# Patient Record
Sex: Female | Born: 1980 | Race: Black or African American | Hispanic: No | Smoking: Current every day smoker
Health system: Southern US, Community
[De-identification: ages and names within clinical notes are randomized; demographics above are authoritative.]

## PROBLEM LIST (undated history)

## (undated) ENCOUNTER — Inpatient Hospital Stay (HOSPITAL_COMMUNITY): Payer: Self-pay

## (undated) DIAGNOSIS — IMO0002 Reserved for concepts with insufficient information to code with codable children: Secondary | ICD-10-CM

## (undated) DIAGNOSIS — J45909 Unspecified asthma, uncomplicated: Secondary | ICD-10-CM

## (undated) DIAGNOSIS — M419 Scoliosis, unspecified: Secondary | ICD-10-CM

## (undated) HISTORY — PX: HAND SURGERY: SHX662

## (undated) HISTORY — PX: DILATION AND CURETTAGE OF UTERUS: SHX78

---

## 2004-05-11 ENCOUNTER — Emergency Department (HOSPITAL_COMMUNITY): Admission: EM | Admit: 2004-05-11 | Discharge: 2004-05-11 | Payer: Self-pay | Admitting: Emergency Medicine

## 2005-01-09 ENCOUNTER — Emergency Department (HOSPITAL_COMMUNITY): Admission: EM | Admit: 2005-01-09 | Discharge: 2005-01-09 | Payer: Self-pay | Admitting: Emergency Medicine

## 2005-04-20 ENCOUNTER — Inpatient Hospital Stay (HOSPITAL_COMMUNITY): Admission: AD | Admit: 2005-04-20 | Discharge: 2005-04-21 | Payer: Self-pay | Admitting: Obstetrics & Gynecology

## 2005-04-21 ENCOUNTER — Inpatient Hospital Stay (HOSPITAL_COMMUNITY): Admission: AD | Admit: 2005-04-21 | Discharge: 2005-04-21 | Payer: Self-pay | Admitting: Family Medicine

## 2005-04-25 ENCOUNTER — Inpatient Hospital Stay (HOSPITAL_COMMUNITY): Admission: AD | Admit: 2005-04-25 | Discharge: 2005-04-25 | Payer: Self-pay | Admitting: Obstetrics & Gynecology

## 2005-04-29 ENCOUNTER — Inpatient Hospital Stay (HOSPITAL_COMMUNITY): Admission: AD | Admit: 2005-04-29 | Discharge: 2005-04-29 | Payer: Self-pay | Admitting: Family Medicine

## 2005-05-06 ENCOUNTER — Observation Stay (HOSPITAL_COMMUNITY): Admission: AD | Admit: 2005-05-06 | Discharge: 2005-05-07 | Payer: Self-pay | Admitting: Obstetrics & Gynecology

## 2005-05-13 ENCOUNTER — Inpatient Hospital Stay (HOSPITAL_COMMUNITY): Admission: AD | Admit: 2005-05-13 | Discharge: 2005-05-13 | Payer: Self-pay | Admitting: Obstetrics & Gynecology

## 2005-05-13 ENCOUNTER — Ambulatory Visit: Payer: Self-pay | Admitting: Obstetrics & Gynecology

## 2005-05-14 ENCOUNTER — Inpatient Hospital Stay (HOSPITAL_COMMUNITY): Admission: AD | Admit: 2005-05-14 | Discharge: 2005-05-14 | Payer: Self-pay | Admitting: Obstetrics & Gynecology

## 2005-06-04 ENCOUNTER — Inpatient Hospital Stay (HOSPITAL_COMMUNITY): Admission: AD | Admit: 2005-06-04 | Discharge: 2005-06-04 | Payer: Self-pay | Admitting: *Deleted

## 2005-06-11 ENCOUNTER — Inpatient Hospital Stay (HOSPITAL_COMMUNITY): Admission: AD | Admit: 2005-06-11 | Discharge: 2005-06-12 | Payer: Self-pay | Admitting: Family Medicine

## 2005-06-14 ENCOUNTER — Inpatient Hospital Stay (HOSPITAL_COMMUNITY): Admission: AD | Admit: 2005-06-14 | Discharge: 2005-06-14 | Payer: Self-pay | Admitting: Obstetrics and Gynecology

## 2005-06-18 ENCOUNTER — Inpatient Hospital Stay (HOSPITAL_COMMUNITY): Admission: AD | Admit: 2005-06-18 | Discharge: 2005-06-19 | Payer: Self-pay | Admitting: Obstetrics and Gynecology

## 2005-06-24 ENCOUNTER — Inpatient Hospital Stay (HOSPITAL_COMMUNITY): Admission: AD | Admit: 2005-06-24 | Discharge: 2005-06-25 | Payer: Self-pay | Admitting: Obstetrics and Gynecology

## 2005-07-06 ENCOUNTER — Inpatient Hospital Stay (HOSPITAL_COMMUNITY): Admission: AD | Admit: 2005-07-06 | Discharge: 2005-07-08 | Payer: Self-pay | Admitting: Obstetrics and Gynecology

## 2005-07-23 ENCOUNTER — Inpatient Hospital Stay (HOSPITAL_COMMUNITY): Admission: AD | Admit: 2005-07-23 | Discharge: 2005-07-23 | Payer: Self-pay | Admitting: Obstetrics and Gynecology

## 2005-08-04 ENCOUNTER — Inpatient Hospital Stay (HOSPITAL_COMMUNITY): Admission: AD | Admit: 2005-08-04 | Discharge: 2005-08-05 | Payer: Self-pay | Admitting: Obstetrics and Gynecology

## 2005-08-11 ENCOUNTER — Inpatient Hospital Stay (HOSPITAL_COMMUNITY): Admission: AD | Admit: 2005-08-11 | Discharge: 2005-08-12 | Payer: Self-pay | Admitting: Obstetrics and Gynecology

## 2005-08-29 ENCOUNTER — Inpatient Hospital Stay (HOSPITAL_COMMUNITY): Admission: AD | Admit: 2005-08-29 | Discharge: 2005-08-29 | Payer: Self-pay | Admitting: Obstetrics and Gynecology

## 2005-10-02 ENCOUNTER — Inpatient Hospital Stay (HOSPITAL_COMMUNITY): Admission: AD | Admit: 2005-10-02 | Discharge: 2005-10-02 | Payer: Self-pay | Admitting: Obstetrics and Gynecology

## 2005-11-25 ENCOUNTER — Inpatient Hospital Stay (HOSPITAL_COMMUNITY): Admission: AD | Admit: 2005-11-25 | Discharge: 2005-11-27 | Payer: Self-pay | Admitting: Obstetrics and Gynecology

## 2008-01-19 ENCOUNTER — Inpatient Hospital Stay (HOSPITAL_COMMUNITY): Admission: AD | Admit: 2008-01-19 | Discharge: 2008-01-19 | Payer: Self-pay | Admitting: Gynecology

## 2008-01-30 ENCOUNTER — Inpatient Hospital Stay (HOSPITAL_COMMUNITY): Admission: AD | Admit: 2008-01-30 | Discharge: 2008-01-30 | Payer: Self-pay | Admitting: Obstetrics

## 2008-02-05 ENCOUNTER — Inpatient Hospital Stay (HOSPITAL_COMMUNITY): Admission: AD | Admit: 2008-02-05 | Discharge: 2008-02-07 | Payer: Self-pay | Admitting: Obstetrics

## 2008-02-23 ENCOUNTER — Inpatient Hospital Stay (HOSPITAL_COMMUNITY): Admission: AD | Admit: 2008-02-23 | Discharge: 2008-02-24 | Payer: Self-pay | Admitting: Obstetrics

## 2008-02-28 ENCOUNTER — Inpatient Hospital Stay (HOSPITAL_COMMUNITY): Admission: AD | Admit: 2008-02-28 | Discharge: 2008-02-29 | Payer: Self-pay | Admitting: Obstetrics

## 2008-03-02 ENCOUNTER — Inpatient Hospital Stay (HOSPITAL_COMMUNITY): Admission: AD | Admit: 2008-03-02 | Discharge: 2008-03-05 | Payer: Self-pay | Admitting: Obstetrics

## 2008-04-12 ENCOUNTER — Emergency Department (HOSPITAL_COMMUNITY): Admission: EM | Admit: 2008-04-12 | Discharge: 2008-04-12 | Payer: Self-pay | Admitting: Emergency Medicine

## 2009-08-02 DIAGNOSIS — R87619 Unspecified abnormal cytological findings in specimens from cervix uteri: Secondary | ICD-10-CM

## 2009-08-02 DIAGNOSIS — IMO0002 Reserved for concepts with insufficient information to code with codable children: Secondary | ICD-10-CM

## 2009-08-02 HISTORY — DX: Reserved for concepts with insufficient information to code with codable children: IMO0002

## 2009-08-02 HISTORY — DX: Unspecified abnormal cytological findings in specimens from cervix uteri: R87.619

## 2010-03-08 ENCOUNTER — Emergency Department (HOSPITAL_BASED_OUTPATIENT_CLINIC_OR_DEPARTMENT_OTHER): Admission: EM | Admit: 2010-03-08 | Discharge: 2010-03-08 | Payer: Self-pay | Admitting: Emergency Medicine

## 2010-08-23 ENCOUNTER — Encounter: Payer: Self-pay | Admitting: Family Medicine

## 2010-12-15 NOTE — Discharge Summary (Signed)
NAMETALAYLA, DOYEL               ACCOUNT NO.:  [id number]   MEDICAL RECORD NO.:  [id number]          PATIENT TYPE:  INP   LOCATION:  9309                          [facility]:  [facility]   PHYSICIAN:  Kathreen Cosier, M.D.DATE OF BIRTH:  08-20-1980   DATE OF ADMISSION:  02/05/2008  DATE OF DISCHARGE:  02/07/2008                               DISCHARGE SUMMARY   The patient is a 30 year old para 2, 7.[redacted] weeks pregnant.  The patient  was admitted because of hyperemesis and she was taking Zofran at home  and Benadryl, and there was no improvement in her symptoms.  She was  admitted and started on the steroid taper and by February 07, 2008, she had  no more vomiting.  The patient also had a very uncooperative attitude  and wanted to be discharged, this was done.  She was discharged on the  Medrol taper to see me in 2 weeks.   DISCHARGE DIAGNOSIS:  Status post hyperemesis.           ______________________________  Kathreen Cosier, M.D.     BAM/MEDQ  D:  04/03/2008  T:  04/03/2008  Job:  [id number]

## 2010-12-15 NOTE — Discharge Summary (Signed)
NAMERANYIA, WITTING               ACCOUNT NO.:  [id number]   MEDICAL RECORD NO.:  [id number]          PATIENT TYPE:  INP   LOCATION:                                [facility]   PHYSICIAN:  Kathreen Cosier, M.D.DATE OF BIRTH:  September 18, 1980   DATE OF ADMISSION:  02/28/2008  DATE OF DISCHARGE:  02/29/2008                               DISCHARGE SUMMARY   The patient is a 30 year old gravida 3, para 2-0-1-2, [facility] September 11, 2008.  She was hospitalized on February 05, 2008, with severe hyperemesis,  which occurs in all of her pregnancies.  She was discharged at that time  on a steroid taper.  She states that with all of her pregnancies she has  this problem into her third trimester.  She was seen by Maternal Fetal  Medicine on February 28, 2008, and she was on the steroid taper and on February 29, 2008, she was trying to tolerate clear liquids and she wanted some  Ensure and she then decided that she wanted to go home and signed  herself out AMA after cursing with staff and everyone else in the  hospital.           ______________________________  Kathreen Cosier, M.D.     BAM/MEDQ  D:  04/03/2008  T:  04/03/2008  Job:  [id number]

## 2010-12-15 NOTE — H&P (Signed)
[name], [name]               ACCOUNT NO.:  [id number]   MEDICAL RECORD NO.:  [id number]          PATIENT TYPE:  INP   LOCATION:  9309                          [facility]   PHYSICIAN:  Roseanna Rainbow, M.D.DATE OF BIRTH:  Sep 28, 1980   DATE OF ADMISSION:  02/05/2008  DATE OF DISCHARGE:                              HISTORY & PHYSICAL   CHIEF COMPLAINT:  The patient is a 30 year old para 2, early pregnant by  dates 7.5 weeks complaining of nausea and vomiting.   HISTORY OF PRESENT ILLNESS:  Please see the above.  The patient has a  history of pregnancy-related nausea and vomiting with her 2 prior  pregnancies.  With the last pregnancy, the vomiting persisted into the  third trimester.  She also has constant drooling.  She is currently  taking Zofran for the nausea.   PAST MEDICAL HISTORY:  She denies.   ALLERGIES:  PHENERGAN.   SOCIAL HISTORY:  Current smoker.  She denies any drug abuse.  No alcohol  use.   FAMILY HISTORY:  Noncontributory.   PAST OB/GYN HISTORY:  There is a history of chlamydia.  Please see the  above.   MEDICATIONS:  Zofran.   REVIEW OF SYSTEMS:  GI:  Please see the above.   PHYSICAL EXAMINATION:  VITAL SIGNS:  Blood pressures 90s to 150s over  50s to 90s, temperature 97.3, pulse 60, and respirations 20.  GENERAL:  No apparent distress.  ABDOMEN:  Nontender.  PELVIS:  Deferred.   LABORATORY DATA:  White blood cell count 11.6, hemoglobin 12.8, and  platelets 314,000.  CMET normal.  Urinalysis remarkable for greater than  80 ketones.   ASSESSMENT:  Multipara at 7 plus weeks with hyperemesis gravidarum.   PLAN:  Admission.  We will add Reglan and Benadryl to the Zofran, the  patient is currently taking.  We will keep the patient n.p.o. until the  nausea is better controlled.  We will obtain an ultrasound for dating.  We will review the previous chart.  Also, we will obtain a nutrition  consult.      Roseanna Rainbow, M.D.  Electronically Signed     LAJ/MEDQ  D:  02/06/2008  T:  02/06/2008  Job:  [id number]

## 2010-12-18 NOTE — H&P (Signed)
[name], [name]               ACCOUNT NO.:  [id number]   MEDICAL RECORD NO.:  [id number]          PATIENT TYPE:  MAT   LOCATION:  MATC                          [facility]  [facility]   PHYSICIAN:  Malachi Pro. Ambrose Mantle, M.D. DATE OF BIRTH:  [birth year]   DATE OF ADMISSION:  06/18/2005  DATE OF DISCHARGE:                                HISTORY & PHYSICAL   PRESENT ILLNESS:  This is a 30 year old black single female para 1-0-0-1  gravida 2 with EDC Dec 04, 2005, by ultrasound done on June 11, 2005,  admitted with persistent hyperemesis gravidarum. This patient apparently has  been to the hospital eight times previously for nausea and vomiting of  pregnancy, and now comes to our office for her ninth evaluation for  hyperemesis. She is admitted to the hospital because of persistent nausea  and vomiting after having been treated previously with Zofran, Reglan, and  Phenergan. She claims that she is unable to keep anything down and is  admitted for that reason.   PAST MEDICAL HISTORY:  Reveals no known allergies, no operations, no  significant illnesses.   OBSTETRICAL HISTORY:  In September 2000 she had a 6-pound 8-ounce female  infant delivered vaginally after a 12-hour labor at [redacted] weeks gestation  without complications. The patient did have chlamydia during this pregnancy  2 months ago and was treated with azithromycin.   FAMILY HISTORY:  Paternal grandmother has high blood pressure and diabetes.  Her mother has sickle cell disease. The patient herself has sickle cell  trait.   REVIEW OF SYSTEMS:  Noncontributory except for persistent nausea and  vomiting and for dizziness that she attributes to Zofran.   PHYSICAL EXAMINATION:  GENERAL:  Reveals a well-developed, slender black  female/  VITAL SIGNS:  Blood pressure 90/60, pulse of 90, weight 98.5 pounds.  HEENT:  Normal.  LUNGS:  Clear to auscultation.  HEART:  Normal size and sounds, no murmurs.  BREASTS:  Not examined.  ABDOMEN:   Soft. Fundal height is about 8-9 cm. Fetal heart tones are normal.  PELVIC:  Cervix is closed. Uterus about 15- to 16 week size. The adnexa are  clear.   ADMITTING IMPRESSION:  Intrauterine pregnancy at 15 weeks 6 days, persistent  hyperemesis gravidarum.   The patient is admitted for hepatitis B surface antigen, RPR, rubella titer,  blood group and type. She has already undergone an STD panel here, and has  had a Pap smear. The patient is admitted for IV fluids.     Malachi Pro. Ambrose Mantle, M.D.  Electronically Signed    TFH/MEDQ  D:  06/18/2005  T:  06/18/2005  Job:  [id number]

## 2010-12-18 NOTE — Discharge Summary (Signed)
[name], MAIDEN               ACCOUNT NO.:  [id number]   MEDICAL RECORD NO.:  [id number]          PATIENT TYPE:  INP   LOCATION:  9303                          [facility]   PHYSICIAN:  Kathreen Cosier, M.D.DATE OF BIRTH:  Dec 11, 1980   DATE OF ADMISSION:  03/02/2008  DATE OF DISCHARGE:  03/05/2008                               DISCHARGE SUMMARY   The patient is a 29 year old gravida 2, para 1, and 11 weeks' pregnant  was admitted with persistent nausea and vomiting.  She was treated at  home with Zofran and Reglan with no improvement, and she was brought in  and started on the IV steroid taper, and by March 04, 2008, felt much  better and was tolerating her regular diet.  She was discharged on  March 05, 2008, on Zofran 4 mg p.o. every 4-6 hours, Pepcid 20 mg  b.i.d., and a p.o. Medrol taper and also Ensure 3 times a day.   DISCHARGE DIAGNOSIS:  Status post hospitalization for hyperemesis.           ______________________________  Kathreen Cosier, M.D.     BAM/MEDQ  D:  03/20/2008  T:  03/20/2008  Job:  [id number]

## 2010-12-18 NOTE — Discharge Summary (Signed)
NAMEBASIL, [name]               ACCOUNT NO.:  [id number]   MEDICAL RECORD NO.:  [id number]          PATIENT TYPE:  INP   LOCATION:  9108                          [facility]   PHYSICIAN:  Huel Cote, M.D. DATE OF BIRTH:  July 12, 1981   DATE OF ADMISSION:  11/25/2005  DATE OF DISCHARGE:  11/27/2005                                 DISCHARGE SUMMARY   DISCHARGE DIAGNOSES:  1.  Term pregnancy at 38 plus weeks, delivered.  2.  Status post normal spontaneous vaginal delivery.  3.  Prenatal care complicated by severe nausea, vomiting during pregnancy.   DISCHARGE MEDICATIONS:  1.  Motrin 600 mg p.o. every 6 hours.  2.  Percocet 1-2 tablets every 4 hours p.r.n.  3.  Depo-Provera 150 mg IM x1 per patient request.   HOSPITAL COURSE:  The patient's 30 year old, G2, P1-0-0-1 who was admitted  for induction of labor and 38-5/7 weeks. As stated, her prenatal care been  complicated by excessive nausea and vomiting which required several  admissions and had been on p.o. Zofran her entire pregnancy. Prenatal labs  are as follows. O+, antibody negative, hepatitis C negative, RPR  nonreactive, rubella immune, hepatitis B surface antigen negative, HIV  negative, GC negative, chlamydia negative, group B strep negative, triple  screen normal, 1-hour Glucola 95.   PAST MEDICAL HISTORY:  Sickle cell trait hemoglobin C present with some  anemia.   SIGNIFICANT PAST SURGICAL HISTORY:  She had a ganglion cyst removed from her  wrist in 1999.   PAST GYN HISTORY:  Remote history of chlamydia.  She also had a normal Pap  smear with a colposcopy performed this pregnancy and planned followup  postpartum.   PAST OBSTETRICAL HISTORY:  In 2000, she had a 6 pound 8 ounce female infant  vaginally delivered.   HOSPITAL COURSE:  The patient was admitted and on admission had a cervix  that was 1, 50% , -2 station.  She was placed on Pitocin and progressed and  had rupture of membranes performed at 3-4 cm.   She then rapidly progressed  to complete dilation and pushed well with a vigorous female infant delivered,  5 pounds 12 ounces, Apgars were 8 and 9.  She was admitted for routine  postpartum care. By postpartum day #2, she was feeling quite well.  Her  nausea had completely resolved, and she requested discharge home.  She also  requested  Depo-Provera 150 mg IM prior to discharge for birth control, and  this was given as per her request.      Huel Cote, M.D.  Electronically Signed    KR/MEDQ  D:  11/27/2005  T:  11/28/2005  Job:  [id number]

## 2010-12-18 NOTE — Discharge Summary (Signed)
[name], [name]               ACCOUNT NO.:  [id number]   MEDICAL RECORD NO.:  [id number]          PATIENT TYPE:  INP   LOCATION:  9318                          [facility]   PHYSICIAN:  Huel Cote, M.D. DATE OF BIRTH:  Dec 19, 1980   DATE OF ADMISSION:  07/06/2005  DATE OF DISCHARGE:  07/08/2005                                 DISCHARGE SUMMARY   DISCHARGE DIAGNOSES:  Preterm pregnancy at 18 weeks with recurrent nausea  and vomiting.   DISCHARGE MEDICATIONS:  1.  Reglan 10 mg p.o. every eight hours.  2.  Zofran 8 mg p.o. t.i.d. p.r.n.   The patient was instructed to follow up in the office within one week of  discharge.   HOSPITAL COURSE:  The patient is a 30 year old G2, P1-0-0-1, who was  admitted in 82 weeks' gestation with complaint of recurrent nausea and  vomiting.  She had had numerous visits to maternity admission and previous  admission for hydration and antiemetics, which had resolved intermittently,  however had a new episode of nausea and vomiting which brought her in this  admission.  She was then admitted and placed on IV Reglan.  When she did not  improve after hydration and Zofran in maternity admissions, the patient was  admitted to the floor for overnight hydration and placed on IV Reglan.  By  the next morning the patient was feeling better, however still had some  nausea and vomiting.  She was given the option of continuing hydration and  changing to p.o. Zofran and Reglan and to see if she could tolerate a solid  diet; however, later in the morning the patient insisted on discharge,  stating that she would not eat hospital food as it was not anything she  could tolerate due to her dietary preferences.  She stated that she would  leave AMA if not discharged and insisted upon being discharged home.  Therefore, the patient was allowed discharge home, to follow up in the  office as previously stated.      Huel Cote, M.D.  Electronically  Signed     KR/MEDQ  D:  08/18/2005  T:  08/18/2005  Job:  [id number]

## 2010-12-18 NOTE — Discharge Summary (Signed)
[name], [name]               ACCOUNT NO.:  [id number]   MEDICAL RECORD NO.:  [id number]          PATIENT TYPE:  INP   LOCATION:  9308                          [facility]   PHYSICIAN:  Lesly Dukes, M.D. DATE OF BIRTH:  12-08-1980   DATE OF ADMISSION:  05/06/2005  DATE OF DISCHARGE:  05/07/2005                                 DISCHARGE SUMMARY   ADMISSION DIAGNOSIS:  Hyperemesis gravidarum.   DISCHARGE DIAGNOSES:  1.  Hyperemesis gravidarum.  2.  Nine-week intrauterine pregnancy.   HISTORY OF PRESENT ILLNESS:  The patient is a 30 year old G2, P1, 0, 0, 1 at  9-5/7 weeks by ultrasound, who was admitted with hyperemesis gravidarum.  This has been her fifth visit to the [facility]U. She has lost 11 pounds. She states  that she had the same problem with her last pregnancy until 3 months  gestation. She has been on Phenergan per rectum and by mouth and stated that  has not relieved her nausea and vomiting. She was hospitalized one time in  the previous pregnancy for this. This was an unplanned pregnancy which she  stated she would have terminated if she had the funds, but plans to keep the  baby. On exam she was afebrile, vital signs were stable. She was in no acute  distress. Respiratory was clear to auscultation bilaterally. Normal S1, S2,  no murmurs, gallops or rubs. Her abdomen was soft, nontender and  nondistended. Extremities had no clubbing, cyanosis or edema.   HOSPITAL COURSE:  The patient was admitted and fluid resuscitated. She  received Zofran in her IV fluids. She was started on p.o. Reglan and Zofran.  She said these medications worked better for her than the Phenergan. She was  able to tolerate some liquid as well as food before the time of discharge.  Home Health was arranged to give her a Reglan pump which was started before  she went home.   SIGNIFICANT LABORATORY:  H. pylori was negative. TSH was 0.158 which is low,  her free T3 was 3.3 which is normal, and  her free T4 was 1.23. On the day of  discharge her sodium was 136, potassium 3.6, chloride 102, CO2 24, glucose  113, BUN less than 1, creatinine 0.5. Total bilirubin 0.4, alkaline  phosphatase 39, AST and ALT were 21, total protein 5.7, albumin 3.0, calcium  8.9.   DISCHARGE INSTRUCTIONS:  The patient was discharged to home on a bland diet.  She was to call Monday and make an appointment with [facility]. A Reglan  pump was started with advanced home care as well as p.r.n. IV fluid boluses  if she has ketones in urine, increased specific gravity, weight loss or  vomiting x5 or p.r.n. The Reglan pump can be titrated up. They are to call  the resident on call to adjust the Reglan if needed.     ______________________________  August Saucer Merlene Morse, MD    ______________________________  Lesly Dukes, M.D.   ABC/MEDQ  D:  05/07/2005  T:  05/07/2005  Job:  [id number]

## 2010-12-22 ENCOUNTER — Inpatient Hospital Stay (HOSPITAL_COMMUNITY)
Admission: AD | Admit: 2010-12-22 | Discharge: 2010-12-22 | Disposition: A | Discharge status: Home / Self Care | Payer: Medicaid Other | Source: Ambulatory Visit | Attending: Obstetrics & Gynecology | Admitting: Obstetrics & Gynecology

## 2010-12-22 DIAGNOSIS — A5901 Trichomonal vulvovaginitis: Secondary | ICD-10-CM

## 2010-12-22 DIAGNOSIS — N72 Inflammatory disease of cervix uteri: Secondary | ICD-10-CM | POA: Insufficient documentation

## 2010-12-22 DIAGNOSIS — R109 Unspecified abdominal pain: Secondary | ICD-10-CM | POA: Insufficient documentation

## 2010-12-22 DIAGNOSIS — N390 Urinary tract infection, site not specified: Secondary | ICD-10-CM | POA: Insufficient documentation

## 2010-12-22 LAB — POCT PREGNANCY, URINE: Preg Test, Ur: NEGATIVE

## 2010-12-22 LAB — URINALYSIS, ROUTINE W REFLEX MICROSCOPIC

## 2010-12-22 LAB — URINE MICROSCOPIC-ADD ON

## 2010-12-22 LAB — WET PREP, GENITAL: Clue Cells Wet Prep HPF POC: NONE SEEN

## 2010-12-23 LAB — GC/CHLAMYDIA PROBE AMP, GENITAL
Chlamydia, DNA Probe: NEGATIVE
GC Probe Amp, Genital: NEGATIVE

## 2010-12-27 ENCOUNTER — Inpatient Hospital Stay (INDEPENDENT_AMBULATORY_CARE_PROVIDER_SITE_OTHER)
Admission: RE | Admit: 2010-12-27 | Discharge: 2010-12-27 | Disposition: A | Discharge status: Home / Self Care | Payer: Medicaid Other | Source: Ambulatory Visit | Attending: Emergency Medicine | Admitting: Emergency Medicine

## 2010-12-27 DIAGNOSIS — M549 Dorsalgia, unspecified: Secondary | ICD-10-CM

## 2010-12-27 DIAGNOSIS — S139XXA Sprain of joints and ligaments of unspecified parts of neck, initial encounter: Secondary | ICD-10-CM

## 2011-02-20 ENCOUNTER — Inpatient Hospital Stay (INDEPENDENT_AMBULATORY_CARE_PROVIDER_SITE_OTHER)
Admission: RE | Admit: 2011-02-20 | Discharge: 2011-02-20 | Disposition: A | Discharge status: Home / Self Care | Payer: Medicaid Other | Source: Ambulatory Visit | Attending: Emergency Medicine | Admitting: Emergency Medicine

## 2011-02-20 DIAGNOSIS — S239XXA Sprain of unspecified parts of thorax, initial encounter: Secondary | ICD-10-CM

## 2011-04-29 LAB — URINALYSIS, ROUTINE W REFLEX MICROSCOPIC
Bilirubin Urine: NEGATIVE
Glucose, UA: NEGATIVE
Ketones, ur: 15 — AB
Ketones, ur: 40 — AB
Leukocytes, UA: NEGATIVE
Nitrite: NEGATIVE
Nitrite: NEGATIVE
Protein, ur: NEGATIVE
Protein, ur: NEGATIVE
Specific Gravity, Urine: 1.02
Specific Gravity, Urine: >1.03 — ABNORMAL HIGH
Urobilinogen, UA: 0.2
Urobilinogen, UA: 0.2
pH: 7

## 2011-04-29 LAB — COMPREHENSIVE METABOLIC PANEL
BUN: 6
Glucose, Bld: 120 — ABNORMAL HIGH
Total Bilirubin: 1.2

## 2011-04-29 LAB — URINALYSIS, DIPSTICK ONLY
Bilirubin Urine: NEGATIVE
Ketones, ur: >80 — AB
Protein, ur: NEGATIVE
Specific Gravity, Urine: 1.02
Urobilinogen, UA: 0.2
pH: 8

## 2011-04-29 LAB — CBC
HCT: 38.5
MCV: 83
Platelets: 314
WBC: 11.6 — ABNORMAL HIGH

## 2011-04-29 LAB — URINE MICROSCOPIC-ADD ON

## 2011-04-29 LAB — POCT PREGNANCY, URINE: Operator id: 11741

## 2011-04-30 LAB — BASIC METABOLIC PANEL
CO2: 25
Calcium: 8.7
Creatinine, Ser: <0.3 — ABNORMAL LOW
Sodium: 133 — ABNORMAL LOW

## 2011-04-30 LAB — CBC
HCT: 38.6
Hemoglobin: 12.8
MCHC: 33.2
Platelets: 256
RBC: 4.61

## 2011-04-30 LAB — URINALYSIS, ROUTINE W REFLEX MICROSCOPIC
Bilirubin Urine: NEGATIVE
Hgb urine dipstick: NEGATIVE
Ketones, ur: NEGATIVE
Leukocytes, UA: NEGATIVE
Protein, ur: NEGATIVE
Specific Gravity, Urine: 1.025
Specific Gravity, Urine: 1.02
Urobilinogen, UA: 0.2
pH: 6.5

## 2011-04-30 LAB — COMPREHENSIVE METABOLIC PANEL
CO2: 24
Chloride: 102
Creatinine, Ser: 0.43
GFR calc Af Amer: >60
Total Bilirubin: 0.6

## 2011-04-30 LAB — URINE MICROSCOPIC-ADD ON

## 2011-05-05 LAB — URINALYSIS, ROUTINE W REFLEX MICROSCOPIC
Glucose, UA: NEGATIVE
Ketones, ur: >80 — AB
Protein, ur: 30 — AB
Urobilinogen, UA: 0.2

## 2011-05-05 LAB — DIFFERENTIAL
Basophils Absolute: 0
Basophils Relative: 0
Eosinophils Absolute: 0
Eosinophils Relative: 0
Lymphs Abs: 2.4
Neutrophils Relative %: 75

## 2011-05-05 LAB — CBC
Hemoglobin: 11.6 — ABNORMAL LOW
MCHC: 32.9
RBC: 4.16
WBC: 11.5 — ABNORMAL HIGH

## 2011-05-05 LAB — COMPREHENSIVE METABOLIC PANEL
ALT: 12
AST: 19
Alkaline Phosphatase: 46
CO2: 25
Chloride: 104
GFR calc Af Amer: >60
GFR calc non Af Amer: >60
Glucose, Bld: 114 — ABNORMAL HIGH
Potassium: 3.2 — ABNORMAL LOW
Sodium: 139
Total Bilirubin: 0.7

## 2011-05-05 LAB — URINE MICROSCOPIC-ADD ON

## 2011-10-11 ENCOUNTER — Encounter (HOSPITAL_COMMUNITY): Payer: Self-pay

## 2011-10-11 ENCOUNTER — Inpatient Hospital Stay (HOSPITAL_COMMUNITY)
Admission: AD | Admit: 2011-10-11 | Discharge: 2011-10-11 | Disposition: A | Discharge status: Home / Self Care | Payer: Medicaid Other | Source: Ambulatory Visit | Attending: Obstetrics & Gynecology | Admitting: Obstetrics & Gynecology

## 2011-10-11 ENCOUNTER — Inpatient Hospital Stay (HOSPITAL_COMMUNITY): Payer: Medicaid Other

## 2011-10-11 DIAGNOSIS — R112 Nausea with vomiting, unspecified: Secondary | ICD-10-CM | POA: Insufficient documentation

## 2011-10-11 LAB — URINALYSIS, ROUTINE W REFLEX MICROSCOPIC
Hgb urine dipstick: NEGATIVE
Nitrite: NEGATIVE
Specific Gravity, Urine: 1.015 (ref 1.005–1.030)
Urobilinogen, UA: 0.2 mg/dL (ref 0.0–1.0)
pH: 7.5 (ref 5.0–8.0)

## 2011-10-11 LAB — CBC
MCH: 26.4 pg (ref 26.0–34.0)
MCHC: 34.7 g/dL (ref 30.0–36.0)
Platelets: 320 10*3/uL (ref 150–400)

## 2011-10-11 LAB — WET PREP, GENITAL: Yeast Wet Prep HPF POC: NONE SEEN

## 2011-10-11 LAB — ABO/RH: ABO/RH(D): O POS

## 2011-10-11 LAB — HCG, QUANTITATIVE, PREGNANCY: hCG, Beta Chain, Quant, S: 33044 m[IU]/mL — ABNORMAL HIGH (ref <5)

## 2011-10-11 LAB — POCT PREGNANCY, URINE: Preg Test, Ur: POSITIVE — AB

## 2011-10-11 MED ORDER — ONDANSETRON HCL 8 MG PO TABS: 4.0000 mg | ORAL_TABLET | Freq: Three times a day (TID) | ORAL | Status: DC | PRN

## 2011-10-11 MED ORDER — METOCLOPRAMIDE HCL 5 MG PO TABS: 5.0000 mg | ORAL_TABLET | Freq: Four times a day (QID) | ORAL | Status: DC

## 2011-10-11 MED ORDER — ONDANSETRON 8 MG PO TBDP
8.0000 mg | ORAL_TABLET | Freq: Once | ORAL | Status: AC
  Administered 2011-10-11: 8 mg via ORAL
  Filled 2011-10-11: qty 1

## 2011-10-11 MED ORDER — METOCLOPRAMIDE HCL 5 MG PO TABS: 5.0000 mg | ORAL_TABLET | Freq: Four times a day (QID) | ORAL | Status: DC | PRN

## 2011-10-11 NOTE — MAU Note (Signed)
Pt states lower abdominal cramping started 2 days ago. Denies vaginal bleeding.

## 2011-10-11 NOTE — MAU Provider Note (Signed)
IUP on [place] Medical Screening exam and patient care preformed by advanced practice provider / resident.  Agree with the above management.

## 2011-10-11 NOTE — Discharge Instructions (Signed)
Morning Sickness Morning sickness is when you feel sick to your stomach (nauseous) during pregnancy. This nauseous feeling may or may not come with throwing up (vomiting). It often occurs in the morning, but can be a problem any time of day. While morning sickness is unpleasant, it is usually harmless unless you develop severe and continual vomiting (hyperemesis gravidarum). This condition requires more intense treatment. CAUSES  The cause of morning sickness is not completely known but seems to be related to a sudden increase of two hormones:   Human chorionic gonadotropin (hCG).   Estrogen hormone.  These are elevated in the first part of the pregnancy. TREATMENT  Do not use any medicines (prescription, over-the-counter, or herbal) for morning sickness without first talking to your caregiver. Some patients are helped by the following:  Vitamin B6 (25mg every 8 hours) or vitamin B6 shots.   An antihistamine called doxylamine (10mg every 8 hours).   The herbal medication ginger.  HOME CARE INSTRUCTIONS   Taking multivitamins before getting pregnant can prevent or decrease the severity of morning sickness in most women.   Eat a piece of dry toast or unsalted crackers before getting out of bed in the morning.   Eat 5 or 6 small meals a day.   Eat dry and bland foods (rice, baked potato).   Do not drink liquids with your meals. Drink liquids between meals.   Avoid greasy, fatty, and spicy foods.   Get someone to cook for you if the smell of any food causes nausea and vomiting.   Avoid vitamin pills with iron because iron can cause nausea.   Snack on protein foods between meals if you are hungry.   Eat unsweetened gelatins for deserts.   Wear an acupressure wristband (worn for sea sickness) may be helpful.   Acupuncture may be helpful.   Do not smoke.   Get a humidifier to keep the air in your house free of odors.  SEEK MEDICAL CARE IF:   Your home remedies are not working  and you need medication.   You feel dizzy or lightheaded.   You are losing weight.   You need help with your diet.  SEEK IMMEDIATE MEDICAL CARE IF:   You have persistent and uncontrolled nausea and vomiting.   You pass out (faint).   You have a fever.  MAKE SURE YOU:   Understand these instructions.   Will watch your condition.   Will get help right away if you are not doing well or get worse.  Document Released: 09/09/2006 Document Revised: 07/08/2011 Document Reviewed: 07/07/2007 [employer] Patient Information 2012 [employer].Pregnancy If you are planning on getting pregnant, it is a good idea to make a preconception appointment with your care- giver to discuss having a healthy lifestyle before getting pregnant. Such as, diet, weight, exercise, taking prenatal vitamins especially folic acid (it helps prevent brain and spinal cord defects), avoiding alcohol, smoking and illegal drugs, medical problems (diabetes, convulsions), family history of genetic problems, working conditions and immunizations. It is better to have knowledge of these things and do something about them before getting pregnant. In your pregnancy, it is important to follow certain guidelines to have a healthy baby. It is very important to get good prenatal care and follow your caregiver's instructions. Prenatal care includes all the medical care you receive before your baby's birth. This helps to prevent problems during the pregnancy and childbirth. HOME CARE INSTRUCTIONS   Start your prenatal visits by the 12th week of   pregnancy or before when possible. They are usually scheduled monthly at first. They are more often in the last 2 months before delivery. It is important that you keep your caregiver's appointments and follow your caregiver's instructions regarding medication use, exercise, and diet.   During pregnancy, you are providing food for you and your baby. Eat a regular, well-balanced diet. Choose foods such  as meat, fish, milk and other dairy products, vegetables, fruits, whole-grain breads and cereals. Your caregiver will inform you of the ideal weight gain depending on your current height and weight. Drink lots of liquids. Try to drink 8 glasses of water a day.   Alcohol is associated with a number of birth defects including fetal alcohol syndrome. It is best to avoid alcohol completely. Smoking will cause low birth rate and prematurity. Use of alcohol and nicotine during your pregnancy also increases the chances that your child will be chemically dependent later in their life and may contribute to SIDS (Sudden Infant Death Syndrome).   Do not use illegal drugs.   Only take prescription or over-the-counter medications that are recommended by your caregiver. Other medications can cause genetic and physical problems in the baby.   Morning sickness can often be helped by keeping soda crackers at the bedside. Eat a couple before arising in the morning.   A sexual relationship may be continued until near the end of pregnancy if there are no other problems such as early (premature) leaking of amniotic fluid from the membranes, vaginal bleeding, painful intercourse or belly (abdominal) pain.   Exercise regularly. Check with your caregiver if you are unsure of the safety of some of your exercises.   Do not use hot tubs, steam rooms or saunas. These increase the risk of fainting or passing out and hurting yourself and the baby. Swimming is OK for exercise. Get plenty of rest, including afternoon naps when possible especially in the third trimester.   Avoid toxic odors and chemicals.   Do not wear high heels. They may cause you to lose your balance and fall.   Do not lift over 5 pounds. If you do lift anything, lift with your legs and thighs, not your back.   Avoid long trips, especially in the third trimester.   If you have to travel out of the city or state, take a copy of your medical records with  you.  SEEK IMMEDIATE MEDICAL CARE IF:   You develop an unexplained oral temperature above 102 F (38.9 C), or as your caregiver suggests.   You have leaking of fluid from the vagina. If leaking membranes are suspected, take your temperature and inform your caregiver of this when you call.   There is vaginal spotting or bleeding. Notify your caregiver of the amount and how many pads are used.   You continue to feel sick to your stomach (nauseous) and have no relief from remedies suggested, or you throw up (vomit) blood or coffee ground like materials.   You develop upper abdominal pain.   You have round ligament discomfort in the lower abdominal area. This still must be evaluated by your caregiver.   You feel contractions of the uterus.   You do not feel the baby move, or there is less movement than before.   You have painful urination.   You have abnormal vaginal discharge.   You have persistent diarrhea.   You get a severe headache.   You have problems with your vision.   You develop muscle weakness.     You feel dizzy and faint.   You develop shortness of breath.   You develop chest pain.   You have back pain that travels down to your leg and feet.   You feel irregular or a very fast heartbeat.   You develop excessive weight gain in a short period of time (5 pounds in 3 to 5 days).   You are involved with a domestic violence situation.  Document Released: 07/19/2005 Document Revised: 07/08/2011 Document Reviewed: 01/10/2009 [employer] Patient Information 2012 [employer]. 

## 2011-10-11 NOTE — MAU Provider Note (Signed)
History      Chief Complaint  Patient presents with  . Emesis   HPI  [name] is a 30yo [id number] who presents with a 1 week history of nausea and vomiting, along with a 3 day history of abdominal cramping/pain. 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 Emesis has been 3-4x/day, nonbloody nonbilious. Denies diarrhea. No back pain. Tolerating some liquids and foods. Was not expecting this pregnancy, although not using birth control. Has lived in [place] recently, so no local prenatal provider. Denies fevers.     Past Medical History  Diagnosis Date  . No pertinent past medical history     Past Surgical History  Procedure Date  . No past surgeries     Family History  Problem Relation Age of Onset  . Anesthesia problems Neg Hx     History  Substance Use Topics  . Smoking status: Current Everyday Smoker  . Smokeless tobacco: Not on file  . Alcohol Use: Yes    Allergies:  Allergies  Allergen Reactions  . Phenergan     Makes her jittery    No prescriptions prior to admission    Review of Systems  Constitutional: Negative for fever and diaphoresis.  Eyes: Negative for blurred vision, double vision and photophobia.  Respiratory: Negative for cough and shortness of breath.   Cardiovascular: Negative for chest pain and leg swelling.  Gastrointestinal: Positive for nausea, vomiting and abdominal pain (cramping). Negative for diarrhea.  Genitourinary: Negative for dysuria, urgency, frequency and hematuria.  Skin: Negative for rash.  Neurological: Positive for dizziness and headaches.   Physical Exam   Blood pressure 84/58, pulse 78, temperature 98.8 F (37.1 C), temperature source Oral, resp. rate 16, height 4' 11.5" (1.511 m), weight 48.263 kg (106 lb 6.4 oz), last menstrual period 09/06/2011, SpO2 100.00%.  Physical Exam  Constitutional: She is oriented to person, place, and time. She appears well-developed and  well-nourished. No distress.  HENT:  Head: Normocephalic and atraumatic.  Eyes: EOM are normal.  Neck: Normal range of motion.  Cardiovascular: Normal rate, regular rhythm and normal heart sounds.   Respiratory: Effort normal and breath sounds normal.  GI: Soft. She exhibits no distension and no mass. There is no tenderness. There is no rebound and no guarding.  Musculoskeletal: Normal range of motion.  Neurological: She is alert and oriented to person, place, and time.  Skin: Skin is warm and dry. She is not diaphoretic. No erythema.  Psychiatric: She has a normal mood and affect. Her behavior is normal. Judgment and thought content normal.   U/S: IUP, 6w 2d, EDD 06/03/2012  Results for orders placed during the hospital encounter of 10/11/11 (from the past 24 hour(s))  URINALYSIS, ROUTINE W REFLEX MICROSCOPIC     Status: Abnormal   Collection Time   10/11/11  1:24 PM      Component Value Range   Color, Urine YELLOW  YELLOW    APPearance HAZY (*) CLEAR    Specific Gravity, Urine 1.015  1.005 - 1.030    pH 7.5  5.0 - 8.0    Glucose, UA NEGATIVE  NEGATIVE (mg/dL)   Hgb urine dipstick NEGATIVE  NEGATIVE    Bilirubin Urine NEGATIVE  NEGATIVE    Ketones, ur 15 (*) NEGATIVE (mg/dL)   Protein, ur NEGATIVE  NEGATIVE (mg/dL)   Urobilinogen, UA 0.2  0.0 - 1.0 (mg/dL)   Nitrite NEGATIVE  NEGATIVE    Leukocytes, UA NEGATIVE  NEGATIVE  POCT PREGNANCY, URINE     Status: Abnormal   Collection Time   10/11/11  1:28 PM      Component Value Range   Preg Test, Ur POSITIVE (*) NEGATIVE   HCG, QUANTITATIVE, PREGNANCY     Status: Abnormal   Collection Time   10/11/11  2:01 PM      Component Value Range   hCG, Beta Chain, Quant, [place] [address] (*) <5 (mIU/mL)  ABO/RH     Status: Normal   Collection Time   10/11/11  2:01 PM      Component Value Range   ABO/RH(D) O POS    CBC     Status: Abnormal   Collection Time   10/11/11  2:02 PM      Component Value Range   WBC 9.0  4.0 - 10.5 (K/uL)   RBC 4.39   3.87 - 5.11 (MIL/uL)   Hemoglobin 11.6 (*) 12.0 - 15.0 (g/dL)   HCT 60.4 (*) 54.0 - 46.0 (%)   MCV 76.1 (*) 78.0 - 100.0 (fL)   MCH 26.4  26.0 - 34.0 (pg)   MCHC 34.7  30.0 - 36.0 (g/dL)   RDW 98.1  19.1 - 47.8 (%)   Platelets 320  150 - 400 (K/uL)  WET PREP, GENITAL     Status: Abnormal   Collection Time   10/11/11  2:45 PM      Component Value Range   Yeast Wet Prep HPF POC NONE SEEN  NONE SEEN    Trich, Wet Prep NONE SEEN  NONE SEEN    Clue Cells Wet Prep HPF POC NONE SEEN  NONE SEEN    WBC, Wet Prep HPF POC FEW (*) NONE SEEN      MAU Course  Procedures  Positive pregnancy test. Given abdominal cramping/pain, ordered u/s, cbc, beta-hcg to evaluate for ectopic Along with wet prep, gc/cl Patient given 8mg  zofran Based on urinalysis and hx, no IVF needed at this time Tolerating PO trial, d/c'ed home  Assessment and Plan  1.) IUP  - 5 weeks 0 days based on LMP  - 6 weeks 2 days based on U/S  - gave patient list of local providers for prenatal care  - advised on prenatal vitamins  - b-hcg at appropriate level  - no signs of ectopic  - will need follow up  2.) Nausea/vomiting  - hx of hyperemesis gravidarum  - will give rx for zofran and reglan  - reglan to be used if zofran too expensive at pharmacy  - no s/sx of dehydration severe enough to be hospitalized  Ala Dach 10/11/2011, 2:11 PM

## 2011-10-11 NOTE — MAU Note (Signed)
Patient states she has had nausea and vomiting for about one week. Worse in the morning and the evening, better during the day. Having a little lower abdominal cramping.

## 2011-10-12 ENCOUNTER — Inpatient Hospital Stay (HOSPITAL_COMMUNITY)
Admission: AD | Admit: 2011-10-12 | Discharge: 2011-10-12 | Disposition: A | Discharge status: Home / Self Care | Payer: Medicaid Other | Attending: Obstetrics & Gynecology | Admitting: Obstetrics & Gynecology

## 2011-10-12 ENCOUNTER — Other Ambulatory Visit: Payer: Self-pay | Admitting: Family

## 2011-10-12 DIAGNOSIS — R112 Nausea with vomiting, unspecified: Secondary | ICD-10-CM

## 2011-10-12 DIAGNOSIS — O210 Mild hyperemesis gravidarum: Secondary | ICD-10-CM | POA: Insufficient documentation

## 2011-10-12 LAB — URINALYSIS, ROUTINE W REFLEX MICROSCOPIC
Glucose, UA: NEGATIVE mg/dL
Hgb urine dipstick: NEGATIVE
Specific Gravity, Urine: 1.015 (ref 1.005–1.030)
pH: 8 (ref 5.0–8.0)

## 2011-10-12 LAB — URINE MICROSCOPIC-ADD ON

## 2011-10-12 MED ORDER — ONDANSETRON HCL 8 MG PO TABS: 4.0000 mg | ORAL_TABLET | Freq: Three times a day (TID) | ORAL | Status: AC | PRN

## 2011-10-12 MED ORDER — METOCLOPRAMIDE HCL 5 MG PO TABS: 10.0000 mg | ORAL_TABLET | Freq: Four times a day (QID) | ORAL | Status: DC | PRN

## 2011-10-12 MED ORDER — METOCLOPRAMIDE HCL 5 MG/ML IJ SOLN
10.0000 mg | Freq: Once | INTRAMUSCULAR | Status: AC
  Administered 2011-10-12: 10 mg via INTRAVENOUS
  Filled 2011-10-12: qty 2

## 2011-10-12 MED ORDER — LACTATED RINGERS IV BOLUS (SEPSIS)
1000.0000 mL | Freq: Once | INTRAVENOUS | Status: AC
  Administered 2011-10-12: 1000 mL via INTRAVENOUS

## 2011-10-12 MED ORDER — GLYCOPYRROLATE 1 MG PO TABS: 1.0000 mg | ORAL_TABLET | Freq: Three times a day (TID) | ORAL | Status: DC

## 2011-10-12 NOTE — MAU Note (Signed)
Pt returns via EMS with complaint of continued vomiting since discharge from here this afternoon. States she was unable to get meds filled due to no insurance card.

## 2011-10-12 NOTE — MAU Provider Note (Signed)
History     CSN: [id number]  Arrival date and time: 10/12/11 0350   First Provider Initiated Contact with Patient 10/12/11 0427      Chief Complaint  Patient presents with  . Emesis During Pregnancy   HPI THis is a 31 y.o. at [redacted]w[redacted]d who presents via EMS with nausea and vomiting. She was just here at 2pm for the same complaint. She was given Rx for Zofran, but does not have her medicaid card.  She has a long history of Hyperemesis in all her pregnancies. She was formerly treated with Phenergan, Reglan and zofran.  OB History    Grav Para Term Preterm Abortions TAB SAB Ect Mult Living   4 2 2  0 1 1 0 0 0 2      Past Medical History  Diagnosis Date  . No pertinent past medical history     Past Surgical History  Procedure Date  . No past surgeries     Family History  Problem Relation Age of Onset  . Anesthesia problems Neg Hx     History  Substance Use Topics  . Smoking status: Current Everyday Smoker  . Smokeless tobacco: Not on file  . Alcohol Use: Yes    Allergies:  Allergies  Allergen Reactions  . Phenergan     Makes her jittery    Prescriptions prior to admission  Medication Sig Dispense Refill  . metoCLOPramide (REGLAN) 5 MG tablet Take 1 tablet (5 mg total) by mouth every 6 (six) hours as needed.  40 tablet  1  . ondansetron (ZOFRAN) 8 MG tablet Take 0.5 tablets (4 mg total) by mouth every 8 (eight) hours as needed for nausea.  20 tablet  1    ROS As Above. No fever or bleeding  Physical Exam   Blood pressure 120/53, pulse 103, temperature 98.3 F (36.8 C), resp. rate 18, height 4\' 11"  (1.499 m), weight 106 lb (48.081 kg), last menstrual period 10/11/2011.  Physical Exam  Constitutional: She is oriented to person, place, and time. She appears well-developed. No distress.  HENT:  Head: Normocephalic.  Cardiovascular: Normal rate.   Respiratory: Effort normal.  GI: Soft. She exhibits no distension. There is no tenderness. There is no rebound and  no guarding.  Musculoskeletal: Normal range of motion.  Neurological: She is alert and oriented to person, place, and time.  Skin: Skin is warm and dry.  Psychiatric: She has a normal mood and affect.    MAU Course  Procedures IV D5LR IV Reglan  Results for orders placed during the hospital encounter of 10/12/11 (from the past 24 hour(s))  URINALYSIS, ROUTINE W REFLEX MICROSCOPIC     Status: Abnormal   Collection Time   10/12/11  6:20 AM      Component Value Range   Color, Urine YELLOW  YELLOW    APPearance CLEAR  CLEAR    Specific Gravity, Urine 1.015  1.005 - 1.030    pH 8.0  5.0 - 8.0    Glucose, UA NEGATIVE  NEGATIVE (mg/dL)   Hgb urine dipstick NEGATIVE  NEGATIVE    Bilirubin Urine NEGATIVE  NEGATIVE    Ketones, ur >80 (*) NEGATIVE (mg/dL)   Protein, ur 30 (*) NEGATIVE (mg/dL)   Urobilinogen, UA 0.2  0.0 - 1.0 (mg/dL)   Nitrite NEGATIVE  NEGATIVE    Leukocytes, UA NEGATIVE  NEGATIVE   URINE MICROSCOPIC-ADD ON     Status: Abnormal   Collection Time   10/12/11  6:20 AM  Component Value Range   Squamous Epithelial / LPF RARE  RARE    WBC, UA 0-2  <3 (WBC/hpf)   RBC / HPF 0-2  <3 (RBC/hpf)   Bacteria, UA FEW (*) RARE     Assessment and Plan  A:  Hyperemesis P:  Will hydrate with IVF and give dose of Reglan      May consider Robinul for spitting  [facility] 10/12/2011, 4:37 AM

## 2011-10-12 NOTE — Discharge Instructions (Signed)
Hyperemesis Gravidarum  Hyperemesis gravidarum is a severe form of nausea and vomiting that happens during pregnancy. Hyperemesis is worse than morning sickness. It may cause a woman to have nausea or vomiting all day for many days. It may keep a woman from eating and drinking enough food and liquids. Hyperemesis usually occurs during the first half (the first 20 weeks) of pregnancy. It often goes away once a woman is in her second half of pregnancy. However, sometimes hyperemesis continues through an entire pregnancy.   CAUSES   The cause of this condition is not completely known but is thought to be due to changes in the body's hormones when pregnant. It could be the high level of the pregnancy hormone or an increase in estrogen in the body.   SYMPTOMS    Severe nausea and vomiting.   Nausea that does not go away.   Vomiting that does not allow you to keep any food down.   Weight loss and body fluid loss (dehydration).   Having no desire to eat or not liking food you have previously enjoyed.  DIAGNOSIS   Your caregiver may ask you about your symptoms. Your caregiver may also order blood tests and urine tests to make sure something else is not causing the problem.   TREATMENT   You may only need medicine to control the problem. If medicines do not control the nausea and vomiting, you will be treated in the hospital to prevent dehydration, acidosis, weight loss, and changes in the electrolytes in your body that may harm the unborn baby (fetus). You may need intravenous (IV) fluids.   HOME CARE INSTRUCTIONS    Take all medicine as directed by your caregiver.   Try eating a couple of dry crackers or toast in the morning before getting out of bed.   Avoid foods and smells that upset your stomach.   Avoid fatty and spicy foods. Eat 5 to 6 small meals a day.   Do not drink when eating meals. Drink between meals.   For snacks, eat high protein foods, such as cheese. Eat or suck on things that have ginger in  them. Ginger helps nausea.   Avoid food preparation. The smell of food can spoil your appetite.   Avoid iron pills and iron in your multivitamins until after 3 to 4 months of being pregnant.  SEEK MEDICAL CARE IF:    Your abdominal pain increases since the last time you saw your caregiver.   You have a severe headache.   You develop vision problems.   You feel you are losing weight.  SEEK IMMEDIATE MEDICAL CARE IF:    You are unable to keep fluids down.   You vomit blood.   You have constant nausea and vomiting.   You have a fever.   You have excessive weakness, dizziness, fainting, or extreme thirst.  MAKE SURE YOU:    Understand these instructions.   Will watch your condition.   Will get help right away if you are not doing well or get worse.  Document Released: 07/19/2005 Document Revised: 07/08/2011 Document Reviewed: 10/19/2010  Exit[employer] Patient Information 2012 [employer].

## 2011-10-13 ENCOUNTER — Inpatient Hospital Stay (HOSPITAL_COMMUNITY)
Admission: AD | Admit: 2011-10-13 | Discharge: 2011-10-13 | Disposition: A | Discharge status: Home / Self Care | Payer: Medicaid Other | Source: Ambulatory Visit | Attending: Obstetrics & Gynecology | Admitting: Obstetrics & Gynecology

## 2011-10-13 DIAGNOSIS — R112 Nausea with vomiting, unspecified: Secondary | ICD-10-CM

## 2011-10-13 DIAGNOSIS — O210 Mild hyperemesis gravidarum: Secondary | ICD-10-CM | POA: Insufficient documentation

## 2011-10-13 LAB — COMPREHENSIVE METABOLIC PANEL
AST: 14 U/L (ref 0–37)
CO2: 25 mEq/L (ref 19–32)
Chloride: 99 mEq/L (ref 96–112)
Creatinine, Ser: 0.58 mg/dL (ref 0.50–1.10)
GFR calc non Af Amer: >90 mL/min (ref >90)
Total Bilirubin: 0.3 mg/dL (ref 0.3–1.2)

## 2011-10-13 LAB — CBC
HCT: 36.5 % (ref 36.0–46.0)
MCV: 76.4 fL — ABNORMAL LOW (ref 78.0–100.0)
Platelets: 339 10*3/uL (ref 150–400)
RBC: 4.78 MIL/uL (ref 3.87–5.11)
WBC: 9.3 10*3/uL (ref 4.0–10.5)

## 2011-10-13 LAB — URINALYSIS, ROUTINE W REFLEX MICROSCOPIC
Glucose, UA: NEGATIVE mg/dL
Hgb urine dipstick: NEGATIVE
Specific Gravity, Urine: 1.02 (ref 1.005–1.030)
Urobilinogen, UA: 0.2 mg/dL (ref 0.0–1.0)
pH: 7 (ref 5.0–8.0)

## 2011-10-13 LAB — RAPID URINE DRUG SCREEN, HOSP PERFORMED
Barbiturates: NOT DETECTED
Cocaine: NOT DETECTED

## 2011-10-13 MED ORDER — LACTATED RINGERS IV BOLUS (SEPSIS)
1000.0000 mL | Freq: Once | INTRAVENOUS | Status: AC
  Administered 2011-10-13: 1000 mL via INTRAVENOUS

## 2011-10-13 MED ORDER — PROMETHAZINE HCL 25 MG PO TABS: 25.0000 mg | ORAL_TABLET | Freq: Four times a day (QID) | ORAL | Status: AC | PRN

## 2011-10-13 MED ORDER — METOCLOPRAMIDE HCL 5 MG/ML IJ SOLN
10.0000 mg | Freq: Once | INTRAMUSCULAR | Status: AC
  Administered 2011-10-13: 10 mg via INTRAVENOUS
  Filled 2011-10-13: qty 2

## 2011-10-13 MED ORDER — ONDANSETRON HCL 4 MG/2ML IJ SOLN
4.0000 mg | Freq: Once | INTRAMUSCULAR | Status: AC
  Administered 2011-10-13: 4 mg via INTRAVENOUS
  Filled 2011-10-13: qty 2

## 2011-10-13 NOTE — MAU Provider Note (Signed)
History   Pt presents today c/o severe N&V. She was seen yesterday for the same. She states she cannot afford the medication for nausea and she cannot keep anything on her stomach. She denies vag dc, bleeding, or any other sx at this time.  CSN: [id number]  Arrival date and time: 10/13/11 0272   None     Chief Complaint  Patient presents with  . Hyperemesis Gravidarum   HPI  OB History    Grav Para Term Preterm Abortions TAB SAB Ect Mult Living   4 2 2  0 1 1 0 0 0 2      Past Medical History  Diagnosis Date  . No pertinent past medical history     Past Surgical History  Procedure Date  . No past surgeries     Family History  Problem Relation Age of Onset  . Anesthesia problems Neg Hx     History  Substance Use Topics  . Smoking status: Current Everyday Smoker  . Smokeless tobacco: Not on file  . Alcohol Use: Yes    Allergies:  Allergies  Allergen Reactions  . Phenergan     Makes her jittery    Prescriptions prior to admission  Medication Sig Dispense Refill  . glycopyrrolate (ROBINUL) 1 MG tablet Take 1 tablet (1 mg total) by mouth 3 (three) times daily.  60 tablet  0  . metoCLOPramide (REGLAN) 5 MG tablet Take 2 tablets (10 mg total) by mouth every 6 (six) hours as needed.  60 tablet  1  . ondansetron (ZOFRAN) 8 MG tablet Take 0.5 tablets (4 mg total) by mouth every 8 (eight) hours as needed for nausea.  20 tablet  1    Review of Systems  Constitutional: Negative for fever and chills.  Eyes: Negative for blurred vision and double vision.  Respiratory: Negative for cough, hemoptysis, sputum production, shortness of breath and wheezing.   Cardiovascular: Negative for chest pain and palpitations.  Gastrointestinal: Positive for nausea and vomiting. Negative for abdominal pain, diarrhea and constipation.  Genitourinary: Negative for dysuria, urgency, frequency and hematuria.  Neurological: Negative for dizziness and headaches.  Psychiatric/Behavioral:  Negative for depression and suicidal ideas.   Physical Exam   Blood pressure 109/74, pulse 76, temperature 97.2 F (36.2 C), temperature source Oral, resp. rate 18, height 4\' 11"  (1.499 m), weight 106 lb (48.081 kg), last menstrual period 10/11/2011.  Physical Exam  Nursing note and vitals reviewed. Constitutional: She is oriented to person, place, and time. She appears well-developed and well-nourished. No distress.  HENT:  Head: Normocephalic and atraumatic.  Eyes: EOM are normal. Pupils are equal, round, and reactive to light.  GI: Soft. She exhibits no distension and no mass. There is no tenderness. There is no rebound and no guarding.  Neurological: She is alert and oriented to person, place, and time.  Skin: Skin is warm and dry. She is not diaphoretic.  Psychiatric: She has a normal mood and affect. Her behavior is normal. Judgment and thought content normal.    MAU Course  Procedures  Results for orders placed during the hospital encounter of 10/13/11 (from the past 24 hour(s))  CBC     Status: Abnormal   Collection Time   10/13/11 10:35 AM      Component Value Range   WBC 9.3  4.0 - 10.5 (K/uL)   RBC 4.78  3.87 - 5.11 (MIL/uL)   Hemoglobin 12.8  12.0 - 15.0 (g/dL)   HCT 53.6  64.4 - 03.4 (%)  MCV 76.4 (*) 78.0 - 100.0 (fL)   MCH 26.8  26.0 - 34.0 (pg)   MCHC 35.1  30.0 - 36.0 (g/dL)   RDW 16.1  09.6 - 04.5 (%)   Platelets 339  150 - 400 (K/uL)  COMPREHENSIVE METABOLIC PANEL     Status: Abnormal   Collection Time   10/13/11 10:35 AM      Component Value Range   Sodium 135  135 - 145 (mEq/L)   Potassium 3.5  3.5 - 5.1 (mEq/L)   Chloride 99  96 - 112 (mEq/L)   CO2 25  19 - 32 (mEq/L)   Glucose, Bld 115 (*) 70 - 99 (mg/dL)   BUN 8  6 - 23 (mg/dL)   Creatinine, Ser 4.09  0.50 - 1.10 (mg/dL)   Calcium 9.9  8.4 - 81.1 (mg/dL)   Total Protein 7.7  6.0 - 8.3 (g/dL)   Albumin 4.3  3.5 - 5.2 (g/dL)   AST 14  0 - 37 (U/L)   ALT 14  0 - 35 (U/L)   Alkaline Phosphatase  64  39 - 117 (U/L)   Total Bilirubin 0.3  0.3 - 1.2 (mg/dL)   GFR calc non Af Amer >90  >90 (mL/min)   GFR calc Af Amer >90  >90 (mL/min)  URINALYSIS, ROUTINE W REFLEX MICROSCOPIC     Status: Abnormal   Collection Time   10/13/11 12:00 PM      Component Value Range   Color, Urine YELLOW  YELLOW    APPearance HAZY (*) CLEAR    Specific Gravity, Urine 1.020  1.005 - 1.030    pH 7.0  5.0 - 8.0    Glucose, UA NEGATIVE  NEGATIVE (mg/dL)   Hgb urine dipstick NEGATIVE  NEGATIVE    Bilirubin Urine NEGATIVE  NEGATIVE    Ketones, ur >80 (*) NEGATIVE (mg/dL)   Protein, ur NEGATIVE  NEGATIVE (mg/dL)   Urobilinogen, UA 0.2  0.0 - 1.0 (mg/dL)   Nitrite NEGATIVE  NEGATIVE    Leukocytes, UA NEGATIVE  NEGATIVE   URINE RAPID DRUG SCREEN (HOSP PERFORMED)     Status: Normal (Preliminary result)   Collection Time   10/13/11 12:00 PM      Component Value Range   Opiates NONE DETECTED  NONE DETECTED    Cocaine NONE DETECTED  NONE DETECTED    Benzodiazepines NONE DETECTED  NONE DETECTED    Amphetamines NONE DETECTED  NONE DETECTED    Tetrahydrocannabinol PENDING  NONE DETECTED    Barbiturates NONE DETECTED  NONE DETECTED      Assessment and Plan  N&V: discussed with pt at length. Her sx have improved since IV hydration and antiemetics. Will give Rx for Phenergan. Pt does not have a true allergy to phenergan. She denies SOB, hives, itching, chest pain when she takes phenergan. Advised her to try taking 1/2 of the phenergan tablet to limit side effects. Discussed diet, activity, risks, and precautions.   Clinton Gallant. Nolyn Eilert III, DrHSc, MPAS, PA-C  10/13/2011, 10:21 AM

## 2011-10-13 NOTE — MAU Note (Signed)
No adverse effect from medication, still feeling somewhat nauseated, requests additional medications.

## 2011-10-13 NOTE — MAU Note (Signed)
Pt states n/v began about 1 week ago, was seen yesterday and day prior. Was unable to find rx card, then medication wouldn't be ready until today. Felt "bad off" and came to MAU for iv fluids, then will get her rx filled. Zofran and reglan rx's given.

## 2011-10-13 NOTE — Discharge Instructions (Signed)
B.R.A.T. Diet Your doctor has recommended the B.R.A.T. diet for you or your child until the condition improves. This is often used to help control diarrhea and vomiting symptoms. If you or your child can tolerate clear liquids, you may have:  Bananas.   Mitesh Rosendahl.   Applesauce.   Toast (and other simple starches such as crackers, potatoes, noodles).  Be sure to avoid dairy products, meats, and fatty foods until symptoms are better. Fruit juices such as apple, grape, and prune juice can make diarrhea worse. Avoid these. Continue this diet for 2 days or as instructed by your caregiver. Document Released: 07/19/2005 Document Revised: 07/08/2011 Document Reviewed: 01/05/2007 ExitCare Patient Information 2012 [employer].Nausea and Vomiting Nausea means you feel sick to your stomach. Throwing up (vomiting) is a reflex where stomach contents come out of your mouth. HOME CARE   Take medicine as told by your doctor.   Do not force yourself to eat. However, you do need to drink fluids.   If you feel like eating, eat a normal diet as told by your doctor.   Eat Allena Pietila, wheat, potatoes, bread, lean meats, yogurt, fruits, and vegetables.   Avoid high-fat foods.   Drink enough fluids to keep your pee (urine) clear or pale yellow.   Ask your doctor how to replace body fluid losses (rehydrate). Signs of body fluid loss (dehydration) include:   Feeling very thirsty.   Dry lips and mouth.   Feeling dizzy.   Dark pee.   Peeing less than normal.   Feeling confused.   Fast breathing or heart rate.  GET HELP RIGHT AWAY IF:   You have blood in your throw up.   You have black or bloody poop (stool).   You have a bad headache or stiff neck.   You feel confused.   You have bad belly (abdominal) pain.   You have chest pain or trouble breathing.   You do not pee at least once every 8 hours.   You have cold, clammy skin.   You keep throwing up after 24 to 48 hours.   You have a fever.    MAKE SURE YOU:   Understand these instructions.   Will watch your condition.   Will get help right away if you are not doing well or get worse.  Document Released: 01/05/2008 Document Revised: 07/08/2011 Document Reviewed: 12/18/2010 ExitCare Patient Information 2012 [employer]. 

## 2011-10-13 NOTE — MAU Provider Note (Signed)
Attestation of Attending Supervision of Advanced Practitioner: Evaluation and management procedures were performed by the PA/NP/CNM/OB Fellow under my supervision/collaboration. Chart reviewed, and agree with management and plan.  Jaynie Collins, M.D. 10/13/2011 1:26 PM

## 2011-10-13 NOTE — Progress Notes (Signed)
Case Management notified of pt's medication assistance need.  Pt was seen in MAU on 3/12 and given Reglan and Zofran Rx, per pt those meds are $9.00 and she can not afford that.  When seen in MAU today, pt was given Phenergan Rx which is on the $4.00 list at [facility].  Spoke w/ pt regarding medication assistance program and only using it once in a 12 month period.  Pt voiced understanding and that she could afford the Phenergan.  Nurse aware.  CM available to assist as needed.  TJohnson, RNBSN  [phone]

## 2011-10-23 NOTE — MAU Provider Note (Signed)
Medical Screening exam and patient care preformed by advanced practice provider.  Agree with the above management.  

## 2012-01-10 ENCOUNTER — Emergency Department (HOSPITAL_COMMUNITY)
Admission: EM | Admit: 2012-01-10 | Discharge: 2012-01-10 | Disposition: A | Discharge status: Home / Self Care | Payer: Medicaid Other | Attending: Emergency Medicine | Admitting: Emergency Medicine

## 2012-01-10 ENCOUNTER — Encounter (HOSPITAL_COMMUNITY): Payer: Self-pay | Admitting: *Deleted

## 2012-01-10 DIAGNOSIS — L739 Follicular disorder, unspecified: Secondary | ICD-10-CM

## 2012-01-10 DIAGNOSIS — R21 Rash and other nonspecific skin eruption: Secondary | ICD-10-CM | POA: Insufficient documentation

## 2012-01-10 DIAGNOSIS — L0390 Cellulitis, unspecified: Secondary | ICD-10-CM

## 2012-01-10 MED ORDER — SULFAMETHOXAZOLE-TRIMETHOPRIM 800-160 MG PO TABS: 2.0000 | ORAL_TABLET | Freq: Two times a day (BID) | ORAL | Status: AC

## 2012-01-10 NOTE — ED Provider Notes (Signed)
History     CSN: [id number]  Arrival date & time 01/10/12  1830   First MD Initiated Contact with Patient 01/10/12 1853      Chief Complaint  Patient presents with  . Rash    (Consider location/radiation/quality/duration/timing/severity/associated sxs/prior treatment) HPI Patient presents emergency department with bumps on her right arm and left medial thigh, states there is 5-6 on her left thigh and is about 8 on her forearm and upper arm.  Patient also has 2 bumps on her face that it comes, swollen and tender to the touch.  Located over the area just above the right jawline near her cheek.  Patient, states she tried to squeeze the bumps on her face that seem to make the issue worse.  Patient denies fevers, nausea, vomiting, weakness, lethargy, or neck swelling.  Patient, states the bumps have a mild itch to them.  Patient is a tried any medications prior to arrival for the areas Past Medical History  Diagnosis Date  . No pertinent past medical history     Past Surgical History  Procedure Date  . No past surgeries     Family History  Problem Relation Age of Onset  . Anesthesia problems Neg Hx     History  Substance Use Topics  . Smoking status: Current Everyday Smoker    Types: Cigarettes  . Smokeless tobacco: Not on file  . Alcohol Use: Yes    OB History    Grav Para Term Preterm Abortions TAB SAB Ect Mult Living   4 2 2  0 1 1 0 0 0 2      Review of Systems All other systems negative except as documented in the HPI. All pertinent positives and negatives as reviewed in the HPI.  Allergies  Promethazine hcl  Home Medications  No current outpatient prescriptions on file.  BP 99/59  Pulse 92  Temp(Src) 99 F (37.2 C) (Oral)  Resp 20  SpO2 100%  LMP 12/12/2011  Breastfeeding? Unknown  Physical Exam  Nursing note and vitals reviewed. Constitutional: She is oriented to person, place, and time. She appears well-developed and well-nourished.  HENT:  Head:  Normocephalic and atraumatic.    Eyes: Pupils are equal, round, and reactive to light.  Cardiovascular: Normal rate and regular rhythm.   Pulmonary/Chest: Effort normal and breath sounds normal. No respiratory distress.  Neurological: She is alert and oriented to person, place, and time.  Skin: Skin is warm and dry. Rash noted.       ED Course  Procedures (including critical care time) These bumps on her arm and maybe follicular type issue vs. a contact dermatitis.  Patient be given antibiotics for the area on her face and and told to use heat on the area.  She is advised to return here for any worsening in her condition  MDM          Carlyle Dolly, PA-C 01/10/12 1950

## 2012-01-10 NOTE — ED Provider Notes (Signed)
Medical screening examination/treatment/procedure(s) were performed by non-physician practitioner and as supervising physician I was immediately available for consultation/collaboration.   Leigh-Ann Vianne Grieshop, MD 01/10/12 2035 

## 2012-01-10 NOTE — ED Notes (Signed)
Pt has small red bumps noted to arms, states that rash is also on legs. Reports was at an outside cookout 2 weeks ago and noticed multiple bug bites. States the rash is now spreading and not going away. Pt brought daughter to ER for same complaint.

## 2012-01-10 NOTE — Discharge Instructions (Signed)
Take Benadryl for the rash.  Return as needed for any worsening in your condition.

## 2012-01-18 ENCOUNTER — Emergency Department (INDEPENDENT_AMBULATORY_CARE_PROVIDER_SITE_OTHER)
Admission: EM | Admit: 2012-01-18 | Discharge: 2012-01-18 | Disposition: A | Discharge status: Home / Self Care | Payer: Self-pay | Source: Home / Self Care

## 2012-01-18 ENCOUNTER — Encounter (HOSPITAL_COMMUNITY): Payer: Self-pay | Admitting: *Deleted

## 2012-01-18 DIAGNOSIS — R51 Headache: Secondary | ICD-10-CM

## 2012-01-18 DIAGNOSIS — L0201 Cutaneous abscess of face: Secondary | ICD-10-CM

## 2012-01-18 MED ORDER — HYDROCODONE-ACETAMINOPHEN 5-325 MG PO TABS
1.0000 | ORAL_TABLET | Freq: Once | ORAL | Status: AC
  Administered 2012-01-18: 1 via ORAL

## 2012-01-18 MED ORDER — DOXYCYCLINE HYCLATE 100 MG PO CAPS: 100.0000 mg | ORAL_CAPSULE | Freq: Two times a day (BID) | ORAL | Status: AC

## 2012-01-18 MED ORDER — HYDROCODONE-ACETAMINOPHEN 5-325 MG PO TABS
ORAL_TABLET | ORAL | Status: AC
  Filled 2012-01-18: qty 1

## 2012-01-18 MED ORDER — ONDANSETRON HCL 4 MG PO TABS: 4.0000 mg | ORAL_TABLET | Freq: Four times a day (QID) | ORAL | Status: AC

## 2012-01-18 MED ORDER — HYDROCODONE-ACETAMINOPHEN 5-500 MG PO TABS: 1.0000 | ORAL_TABLET | Freq: Four times a day (QID) | ORAL | Status: AC | PRN

## 2012-01-18 NOTE — ED Provider Notes (Signed)
History     CSN: [id number]  Arrival date & time 01/18/12  1856   None     Chief Complaint  Patient presents with  . Abscess    (Consider location/radiation/quality/duration/timing/severity/associated sxs/prior treatment) Patient is a 31 y.o. female presenting with abscess. The history is provided by the patient and the spouse.  Abscess   Patient reports right jaw swelling that has gotten increasingly worse in the last two days.  Seen at [facility] long on 01/10/12 for several areas of ?skin infection and was prescribed Septra DS.  Reports other areas have improved in appearance but right jaw has gotten significantly worse.  Denies drainage from wound, no associated fever but she does report chills.  Some nausea noted with throbbing pain 8/10. Denies history of previous wound infection.      Past Medical History  Diagnosis Date  . No pertinent past medical history   . Sickle cell trait     Past Surgical History  Procedure Date  . No past surgeries     Family History  Problem Relation Age of Onset  . Anesthesia problems Neg Hx   . Diabetes Mother   . Sickle cell anemia Mother     History  Substance Use Topics  . Smoking status: Current Everyday Smoker    Types: Cigarettes  . Smokeless tobacco: Not on file  . Alcohol Use: Yes     occasionally    OB History    Grav Para Term Preterm Abortions TAB SAB Ect Mult Living   4 2 2  0 1 1 0 0 0 2      Review of Systems  All other systems reviewed and are negative.    Allergies  Promethazine hcl  Home Medications   Current Outpatient Rx  Name Route Sig Dispense Refill  . SULFAMETHOXAZOLE-TRIMETHOPRIM 800-160 MG PO TABS Oral Take 2 tablets by mouth 2 (two) times daily. 40 tablet 0  . DOXYCYCLINE HYCLATE 100 MG PO CAPS Oral Take 1 capsule (100 mg total) by mouth 2 (two) times daily. 20 capsule 0  . HYDROCODONE-ACETAMINOPHEN 5-500 MG PO TABS Oral Take 1-2 tablets by mouth every 6 (six) hours as needed for pain. 15  tablet 0  . ONDANSETRON HCL 4 MG PO TABS Oral Take 1 tablet (4 mg total) by mouth every 6 (six) hours. 12 tablet 0    BP 97/62  Pulse 82  Temp 98.5 F (36.9 C) (Oral)  Resp 16  SpO2 100%  LMP 12/12/2011  Breastfeeding? No  Physical Exam  Nursing note and vitals reviewed. Constitutional: She is oriented to person, place, and time. Vital signs are normal. She appears well-developed and well-nourished. She is active and cooperative.  HENT:  Head: Normocephalic.  Mouth/Throat: Uvula is midline, oropharynx is clear and moist and mucous membranes are normal.  Eyes: Conjunctivae and EOM are normal. Pupils are equal, round, and reactive to light. No scleral icterus.  Neck: Trachea normal. Neck supple.  Cardiovascular: Normal rate, regular rhythm and normal heart sounds.   Pulmonary/Chest: Effort normal and breath sounds normal.  Lymphadenopathy:    She has no cervical adenopathy.       Tender right submental, mandibular  Neurological: She is alert and oriented to person, place, and time. No cranial nerve deficit or sensory deficit.  Skin: Skin is warm and dry.     Psychiatric: She has a normal mood and affect. Her speech is normal and behavior is normal. Judgment and thought content normal. Cognition and memory are  normal.    ED Course  Procedures (including critical care time)  Labs Reviewed - No data to display No results found.   1. Facial pain   2. Facial abscess       MDM  D/C Septra. Begin Doxycyline.  Warm compresses to area TID, imperative to follow up in 3 days so that we may affirm that area is improving.        Johnsie Kindred, NP 01/18/12 2128

## 2012-01-18 NOTE — Discharge Instructions (Signed)
Abscess An abscess (boil or furuncle) is an infected area under your skin. This area is filled with yellowish white fluid (pus). HOME CARE   Only take medicine as told by your doctor.   Keep the skin clean around your abscess. Keep clothes that may touch the abscess clean.   Change any bandages (dressings) as told by your doctor.   Avoid direct skin contact with other people. The infection can spread by skin contact with others.   Practice good hygiene and do not share personal care items.   Do not share athletic equipment, towels, or whirlpools. Shower after every practice or work out session.   If a draining area cannot be covered:   Do not play sports.   Children should not go to daycare until the wound has healed or until fluid (drainage) stops coming out of the wound.   See your doctor for a follow-up visit as told.  GET HELP RIGHT AWAY IF:   There is more pain, puffiness (swelling), and redness in the wound site.   There is fluid or bleeding from the wound site.   You have muscle aches, chills, fever, or feel sick.   You or your child has a temperature by mouth above 102 F (38.9 C), not controlled by medicine.   Your baby is older than 3 months with a rectal temperature of 102 F (38.9 C) or higher.  MAKE SURE YOU:   Understand these instructions.   Will watch your condition.   Will get help right away if you are not doing well or get worse.  Document Released: 01/05/2008 Document Revised: 07/08/2011 Document Reviewed: 01/05/2008 [employer] Patient Information 2012 [employer]. 

## 2012-01-18 NOTE — ED Notes (Signed)
Pt reports she has had an abscess near her right jaw about 8 days.  She was seen at [facility] several days ago and has been taking bactrim as ordered.  The swelling is  Only slightly better.

## 2012-01-19 ENCOUNTER — Telehealth (HOSPITAL_COMMUNITY): Payer: Self-pay | Admitting: *Deleted

## 2012-01-19 NOTE — ED Notes (Signed)
Pt. called and said she only got 1 Rx. for Hydrocodone yesterday.  She was supposed to get Doxycycline and Zofran as well. I told her they were probably e-prescribed to her preferred pharmacy. She said she does not remember giving one. I accessed her chart and told her it looked like they were sent to [facility] at [facility] and [facility]  She said the Doxycycline is $50.00 at [facility] on [address]. I told her to call and check the price at [facility], if it is to expensive for her there then she can transfer it to [facility]. She asked if she could take fewer antibiotics. I told her no, it has to be the full course. She said she was told to come back in a few days for a recheck. I asked if she got an I and D and packing.  She said no the abscess was on her face. I explained that the doctor wants to see if it will get better with the antibiotics, if not it may need to be drained. It is very important to get started on the antibiotics ASAP.  Pt.voiced understanding. Vassie Moselle 01/19/2012

## 2012-01-19 NOTE — ED Provider Notes (Signed)
Medical screening examination/treatment/procedure(s) were performed by non-physician practitioner and as supervising physician I was immediately available for consultation/collaboration.  Leslee Home, M.D.   Reuben Likes, MD 01/19/12 [phone]

## 2012-01-26 ENCOUNTER — Emergency Department (HOSPITAL_COMMUNITY)
Admission: EM | Admit: 2012-01-26 | Discharge: 2012-01-26 | Disposition: A | Discharge status: Home / Self Care | Payer: Medicaid Other | Source: Home / Self Care | Attending: Emergency Medicine | Admitting: Emergency Medicine

## 2012-01-26 ENCOUNTER — Encounter (HOSPITAL_COMMUNITY): Payer: Self-pay | Admitting: *Deleted

## 2012-01-26 DIAGNOSIS — L0201 Cutaneous abscess of face: Secondary | ICD-10-CM

## 2012-01-26 DIAGNOSIS — L03211 Cellulitis of face: Secondary | ICD-10-CM

## 2012-01-26 MED ORDER — HYDROCODONE-ACETAMINOPHEN 5-325 MG PO TABS
1.0000 | ORAL_TABLET | Freq: Once | ORAL | Status: AC
  Administered 2012-01-26: 1 via ORAL

## 2012-01-26 MED ORDER — HYDROCODONE-ACETAMINOPHEN 5-325 MG PO TABS
ORAL_TABLET | ORAL | Status: AC
  Filled 2012-01-26: qty 1

## 2012-01-26 MED ORDER — HYDROCODONE-ACETAMINOPHEN 5-500 MG PO TABS: 1.0000 | ORAL_TABLET | Freq: Four times a day (QID) | ORAL | Status: AC | PRN

## 2012-01-26 NOTE — Discharge Instructions (Signed)
AS. discussed continue with antibiotics and return in 2 days for packing removal and wound recheck     Abscess An abscess (boil or furuncle) is an infected area that contains a collection of pus.  SYMPTOMS Signs and symptoms of an abscess include pain, tenderness, redness, or hardness. You may feel a moveable soft area under your skin. An abscess can occur anywhere in the body.  TREATMENT  A surgical cut (incision) may be made over your abscess to drain the pus. Gauze may be packed into the space or a drain may be looped through the abscess cavity (pocket). This provides a drain that will allow the cavity to heal from the inside outwards. The abscess may be painful for a few days, but should feel much better if it was drained.  Your abscess, if seen early, may not have localized and may not have been drained. If not, another appointment may be required if it does not get better on its own or with medications. HOME CARE INSTRUCTIONS   Only take over-the-counter or prescription medicines for pain, discomfort, or fever as directed by your caregiver.   Take your antibiotics as directed if they were prescribed. Finish them even if you start to feel better.   Keep the skin and clothes clean around your abscess.   If the abscess was drained, you will need to use gauze dressing to collect any draining pus. Dressings will typically need to be changed 3 or more times a day.   The infection may spread by skin contact with others. Avoid skin contact as much as possible.   Practice good hygiene. This includes regular hand washing, cover any draining skin lesions, and do not share personal care items.   If you participate in sports, do not share athletic equipment, towels, whirlpools, or personal care items. Shower after every practice or tournament.   If a draining area cannot be adequately covered:   Do not participate in sports.   Children should not participate in day care until the wound has  healed or drainage stops.   If your caregiver has given you a follow-up appointment, it is very important to keep that appointment. Not keeping the appointment could result in a much worse infection, chronic or permanent injury, pain, and disability. If there is any problem keeping the appointment, you must call back to this facility for assistance.  SEEK MEDICAL CARE IF:   You develop increased pain, swelling, redness, drainage, or bleeding in the wound site.   You develop signs of generalized infection including muscle aches, chills, fever, or a general ill feeling.   You have an oral temperature above 102 F (38.9 C).  MAKE SURE YOU:   Understand these instructions.   Will watch your condition.   Will get help right away if you are not doing well or get worse.  Document Released: 04/28/2005 Document Revised: 07/08/2011 Document Reviewed: 02/20/2008 [facility] Patient Information 2012 [place], [place].

## 2012-01-26 NOTE — ED Provider Notes (Signed)
History     CSN: [id number]  Arrival date & time 01/26/12  1232   First MD Initiated Contact with Patient 01/26/12 1243      Chief Complaint  Patient presents with  . Abscess    (Consider location/radiation/quality/duration/timing/severity/associated sxs/prior treatment) HPI Comments: Patient presents today to urgent care for further swelling of her right face (right jaw region). Patient reports she went initially to the emergency department with several is areas of skin infection along with 2 big bumps on her face. She was initially prescribed antibiotic Bactrim and told to apply heat compresses to it. Subsequently she presented to the urgent care describing that the area of infection and swelling her face was not doing any better and she was to switched antibiotics to doxycycline.  Patient describes that the area is swollen and puffy and filled with fluid, it's not draining on its own she has not seen any discharge coming out of it. It is very tender at touch and is 3-4 times a day for the what it was initially in the emergency department.    Patient is a 31 y.o. female presenting with abscess. The history is provided by the patient.  Abscess  This is a recurrent problem. The problem occurs continuously. The problem has been unchanged. The abscess is present on the face. The abscess is characterized by redness and swelling. Pertinent negatives include no anorexia and no fever.    Past Medical History  Diagnosis Date  . No pertinent past medical history   . Sickle cell trait     Past Surgical History  Procedure Date  . No past surgeries     Family History  Problem Relation Age of Onset  . Anesthesia problems Neg Hx   . Diabetes Mother   . Sickle cell anemia Mother     History  Substance Use Topics  . Smoking status: Current Everyday Smoker    Types: Cigarettes  . Smokeless tobacco: Not on file  . Alcohol Use: Yes     occasionally    OB History    Grav Para Term  Preterm Abortions TAB SAB Ect Mult Living   4 2 2  0 1 1 0 0 0 2      Review of Systems  Constitutional: Negative for fever, chills, diaphoresis, activity change, appetite change and fatigue.  Eyes: Negative for pain.  Gastrointestinal: Negative for anorexia.  Skin: Positive for color change. Negative for pallor and rash.    Allergies  Promethazine hcl  Home Medications   Current Outpatient Rx  Name Route Sig Dispense Refill  . DOXYCYCLINE HYCLATE 100 MG PO CAPS Oral Take 1 capsule (100 mg total) by mouth 2 (two) times daily. 20 capsule 0  . HYDROCODONE-ACETAMINOPHEN 5-500 MG PO TABS Oral Take 1-2 tablets by mouth every 6 (six) hours as needed for pain. 15 tablet 0  . ONDANSETRON HCL 4 MG PO TABS Oral Take 1 tablet (4 mg total) by mouth every 6 (six) hours. 12 tablet 0    BP 93/58  Pulse 70  Temp 98.8 F (37.1 C) (Oral)  Resp 16  SpO2 95%  LMP 12/12/2011  Physical Exam  Nursing note and vitals reviewed. Constitutional: She appears well-developed and well-nourished.  Non-toxic appearance. She does not have a sickly appearance. She does not appear ill. No distress.  HENT:  Head: Normocephalic.    Mouth/Throat: No oropharyngeal exudate.  Eyes: Conjunctivae are normal.  Neck: Neck supple. No JVD present.  Skin: Skin is warm. No  rash noted.    ED Course  INCISION AND DRAINAGE Date/Time: 01/26/2012 2:06 PM Performed by: Deroy Noah Authorized by: Jimmie Molly Consent: Verbal consent obtained. Written consent not obtained. Consent given by: patient Patient understanding: patient states understanding of the procedure being performed Patient identity confirmed: verbally with patient Type: abscess Body area: head/neck Anesthesia: local infiltration Local anesthetic: lidocaine 2% with epinephrine Anesthetic total: 4 ml Scalpel size: 15 Needle gauge: 22 Complexity: complex Drainage: serosanguinous and purulent Drainage amount: moderate Wound treatment: wound left  open Packing material: 1/4 in gauze Patient tolerance: Patient tolerated the procedure well with no immediate complications.   (including critical care time)  Labs Reviewed - No data to display No results found.   No diagnosis found.    MDM  Facial abscess for more than 2 weeks. Patient has been on Bactrim prior to doxycycline prescription. Patient to return in 2 days for packing removal.      Jimmie Molly, MD 01/26/12 1408

## 2012-01-26 NOTE — ED Notes (Signed)
Pt returns for follow up of abscess of the right jaw area.   She is taking the doxycycline as ordered

## 2012-01-26 NOTE — ED Notes (Signed)
Coverlet band aid applied to wound

## 2012-01-28 ENCOUNTER — Emergency Department (INDEPENDENT_AMBULATORY_CARE_PROVIDER_SITE_OTHER)
Admission: EM | Admit: 2012-01-28 | Discharge: 2012-01-28 | Disposition: A | Discharge status: Home / Self Care | Payer: Medicaid Other | Source: Home / Self Care | Attending: Family Medicine | Admitting: Family Medicine

## 2012-01-28 ENCOUNTER — Encounter (HOSPITAL_COMMUNITY): Payer: Self-pay

## 2012-01-28 DIAGNOSIS — Z5189 Encounter for other specified aftercare: Secondary | ICD-10-CM

## 2012-01-28 NOTE — Discharge Instructions (Signed)
Continue to use warm compresses on your wound.  Keep it covered and use antibacterial ointment on it.  Once it's well on it's way to be healed, you don't have to have it covered.  If you feel like it is getting worse at all, return here for more help with it.    Wound Check Your wound appears healthy today. Your wound will heal gradually over time. Eventually a scar will form that will fade with time. FACTORS THAT AFFECT SCAR FORMATION:  People differ in the severity in which they scar.   Scar severity varies according to location, size, and the traits you inherited from your parents (genetic predisposition).   Irritation to the wound from infection, rubbing, or chemical exposure will increase the amount of scar formation.  HOME CARE INSTRUCTIONS   If you were given a dressing, you should change it at least once a day or as instructed by your caregiver. If the bandage sticks, soak it off with a solution of hydrogen peroxide.   If the bandage becomes wet, dirty, or develops a bad smell, change it as soon as possible.   Look for signs of infection.   Only take over-the-counter or prescription medicines for pain, discomfort, or fever as directed by your caregiver.  SEEK IMMEDIATE MEDICAL CARE IF:   You have redness, swelling, or increasing pain in the wound.   You notice pus coming from the wound.   You have a fever.   You notice a bad smell coming from the wound or dressing.  Document Released: 04/24/2004 Document Revised: 07/08/2011 Document Reviewed: 07/19/2005 [facility] Patient Information 2012 [place], [place].

## 2012-01-28 NOTE — ED Notes (Signed)
Seen here 2 days ago for I&D of rt side facial abscess.  Here today for packing removal.  States taking antibiotics as prescribed.

## 2012-01-28 NOTE — ED Provider Notes (Signed)
History     CSN: [id number]  Arrival date & time 01/28/12  1433   First MD Initiated Contact with Patient 01/28/12 1632      Chief Complaint  Patient presents with  . Wound Check    (Consider location/radiation/quality/duration/timing/severity/associated sxs/prior treatment) Patient is a 31 y.o. female presenting with wound check. The history is provided by the patient.  Wound Check  She was treated in the ED 2 to 3 days ago. Previous treatment in the ED includes I&D of abscess. Treatments since wound repair include oral antibiotics. There has been bloody discharge from the wound. There is no redness present. The swelling has improved. The pain has improved.    Past Medical History  Diagnosis Date  . No pertinent past medical history   . Sickle cell trait     Past Surgical History  Procedure Date  . No past surgeries     Family History  Problem Relation Age of Onset  . Anesthesia problems Neg Hx   . Diabetes Mother   . Sickle cell anemia Mother     History  Substance Use Topics  . Smoking status: Current Everyday Smoker    Types: Cigarettes  . Smokeless tobacco: Not on file  . Alcohol Use: Yes     occasionally    OB History    Grav Para Term Preterm Abortions TAB SAB Ect Mult Living   4 2 2  0 1 1 0 0 0 2      Review of Systems  Constitutional: Negative for fever and chills.  Skin:       Wound recheck.  Still sore, but pt feels is better.     Allergies  Promethazine hcl  Home Medications   Current Outpatient Rx  Name Route Sig Dispense Refill  . DOXYCYCLINE HYCLATE 100 MG PO CAPS Oral Take 1 capsule (100 mg total) by mouth 2 (two) times daily. 20 capsule 0  . HYDROCODONE-ACETAMINOPHEN 5-500 MG PO TABS Oral Take 1-2 tablets by mouth every 6 (six) hours as needed for pain. 15 tablet 0  . HYDROCODONE-ACETAMINOPHEN 5-500 MG PO TABS Oral Take 1-2 tablets by mouth every 6 (six) hours as needed for pain. 15 tablet 0    BP 96/51  Pulse 89  Temp 98.4 F  (36.9 C)  SpO2 100%  LMP 12/12/2011  Physical Exam  Constitutional: She appears well-developed and well-nourished. No distress.  Skin: Skin is warm and dry.       Skin distal to where abscess was, surrounding incision, is starting to slough.  No erythema or warmth of wound, mild tenderness to palp. Packing removed, opted not to replace packing as wound is healing well and size of abscess was small (packing was 1/4" gauze that was approx 3cm in length with some of that length exterior to the body.)    ED Course  Procedures (including critical care time)  Labs Reviewed - No data to display No results found.   1. Abscess re-check       MDM  Reviewed with pt wound care and reasons for returning to [facility].         Cathlyn Parsons, NP 01/28/12 [phone]

## 2012-01-29 NOTE — ED Provider Notes (Signed)
Medical screening examination/treatment/procedure(s) were performed by non-physician practitioner and as supervising physician I was immediately available for consultation/collaboration.   MORENO-COLL,Orenthal Debski; MD   Shalik Sanfilippo Moreno-Coll, MD 01/29/12 1255 

## 2012-03-22 DIAGNOSIS — D573 Sickle-cell trait: Secondary | ICD-10-CM | POA: Insufficient documentation

## 2012-03-22 DIAGNOSIS — M67439 Ganglion, unspecified wrist: Secondary | ICD-10-CM | POA: Insufficient documentation

## 2012-05-30 DIAGNOSIS — M24119 Other articular cartilage disorders, unspecified shoulder: Secondary | ICD-10-CM | POA: Insufficient documentation

## 2012-06-14 ENCOUNTER — Ambulatory Visit: Payer: Medicaid Other | Attending: Family Medicine

## 2012-06-26 DIAGNOSIS — M5124 Other intervertebral disc displacement, thoracic region: Secondary | ICD-10-CM | POA: Insufficient documentation

## 2012-06-26 DIAGNOSIS — M47816 Spondylosis without myelopathy or radiculopathy, lumbar region: Secondary | ICD-10-CM | POA: Insufficient documentation

## 2012-06-30 ENCOUNTER — Encounter (HOSPITAL_COMMUNITY): Payer: Self-pay | Admitting: Emergency Medicine

## 2012-06-30 ENCOUNTER — Emergency Department (INDEPENDENT_AMBULATORY_CARE_PROVIDER_SITE_OTHER)
Admission: EM | Admit: 2012-06-30 | Discharge: 2012-06-30 | Disposition: A | Discharge status: Home / Self Care | Payer: Medicaid Other | Source: Home / Self Care | Attending: Emergency Medicine | Admitting: Emergency Medicine

## 2012-06-30 DIAGNOSIS — J020 Streptococcal pharyngitis: Secondary | ICD-10-CM

## 2012-06-30 LAB — POCT RAPID STREP A: Streptococcus, Group A Screen (Direct): POSITIVE — AB

## 2012-06-30 MED ORDER — PENICILLIN G BENZATHINE 1200000 UNIT/2ML IM SUSP
INTRAMUSCULAR | Status: AC
  Filled 2012-06-30: qty 2

## 2012-06-30 MED ORDER — HYDROCODONE-ACETAMINOPHEN 5-325 MG PO TABS: ORAL_TABLET | ORAL | Status: DC

## 2012-06-30 MED ORDER — PENICILLIN G BENZATHINE 1200000 UNIT/2ML IM SUSP
1.2000 10*6.[IU] | Freq: Once | INTRAMUSCULAR | Status: AC
  Administered 2012-06-30: 1.2 10*6.[IU] via INTRAMUSCULAR

## 2012-06-30 NOTE — ED Notes (Signed)
Patient understands there is a post injection delay per policy

## 2012-06-30 NOTE — ED Notes (Signed)
Denies any S/S allergic reaction. 

## 2012-06-30 NOTE — ED Notes (Signed)
Sore throat onset monday

## 2012-06-30 NOTE — ED Provider Notes (Signed)
Chief Complaint  Patient presents with  . Sore Throat    History of Present Illness:   [name] is a 31 year old female who has had a five-day history of sore throat, pain on swallowing, she vomited once when this first began, she's felt somewhat nauseated, felt chilled, had nasal congestion, rhinorrhea, and sneezing. She has not had any cough, fever, headache, abdominal pain, or diarrhea. There is no known exposure to strep or to mono.  Review of Systems:  Other than as noted above, the patient denies any of the following symptoms. Systemic:  No fever, chills, sweats, fatigue, myalgias, headache, or anorexia. Eye:  No redness, pain or drainage. ENT:  No earache, ear congestion, nasal congestion, sneezing, rhinorrhea, sinus pressure, sinus pain, or post nasal drip. Lungs:  No cough, sputum production, wheezing, shortness of breath, or chest pain. GI:  No abdominal pain, nausea, vomiting, or diarrhea. Skin:  No rash or itching.  PMFSH:  Past medical history, family history, social history, meds, allergies, and nurse's notes were reviewed.  There is no known exposure to strep or mono.  No prior history of step or mono.  The patient denies use of tobacco.  Physical Exam:   Vital signs:  BP 96/63  Pulse 87  Temp 98.6 F (37 C) (Oral)  Resp 16  SpO2 99%  LMP 10/11/2011 General:  Alert, in no distress. Eye:  No conjunctival injection or drainage. Lids were normal. ENT:  TMs and canals were normal, without erythema or inflammation.  Nasal mucosa was clear and uncongested, without drainage.  Mucous membranes were moist.  Exam of pharynx shows enlarged tonsils with erythema and exudate.  There were no oral ulcerations or lesions. Neck:  Supple, she has bilateral tender, anterior cervical lymphadenopathy. Lungs:  No respiratory distress.  Lungs were clear to auscultation, without wheezes, rales or rhonchi.  Breath sounds were clear and equal bilaterally.  Heart:  Regular rhythm, without gallops,  murmers or rubs. Skin:  Clear, warm, and dry, without rash or lesions.  Labs:   Results for orders placed during the hospital encounter of 06/30/12  POCT RAPID STREP A ([facility]RG CARE ONLY)      Component Value Range   Streptococcus, Group A Screen (Direct) POSITIVE (*) NEGATIVE   Course in Urgent Care Center:   She was given LA Bicillin 1.2 million units IM and tolerated this well without any immediate side effects.  Assessment:  The encounter diagnosis was Strep throat.  Plan:   1.  The following meds were prescribed:   New Prescriptions   HYDROCODONE-ACETAMINOPHEN (NORCO/VICODIN) 5-325 MG PER TABLET    1 to 2 tabs every 4 to 6 hours as needed for pain.   2.  The patient was instructed in symptomatic care including hot saline gargles, throat lozenges, infectious precautions, and need to trade out toothbrush. Handouts were given. 3.  The patient was told to return if becoming worse in any way, if no better in 3 or 4 days, and given some red flag symptoms that would indicate earlier return.    Reuben Likes, MD 06/30/12 [phone]

## 2012-11-14 ENCOUNTER — Encounter: Payer: Self-pay | Admitting: Diagnostic Neuroimaging

## 2012-11-14 ENCOUNTER — Ambulatory Visit (INDEPENDENT_AMBULATORY_CARE_PROVIDER_SITE_OTHER): Payer: Medicaid Other | Admitting: Diagnostic Neuroimaging

## 2012-11-14 VITALS — BP 98/61 | HR 77 | Temp 99.2°F | Ht 59.0 in | Wt 96.0 lb

## 2012-11-14 DIAGNOSIS — Z8669 Personal history of other diseases of the nervous system and sense organs: Secondary | ICD-10-CM

## 2012-11-14 DIAGNOSIS — G5139 Clonic hemifacial spasm, unspecified: Secondary | ICD-10-CM

## 2012-11-14 DIAGNOSIS — G518 Other disorders of facial nerve: Secondary | ICD-10-CM

## 2012-11-14 MED ORDER — BACLOFEN 10 MG PO TABS: 5.0000 mg | ORAL_TABLET | Freq: Three times a day (TID) | ORAL | Status: DC

## 2012-11-14 NOTE — Progress Notes (Signed)
[facility]  PATIENT: [name] DOB: [birth year]  REFERRING CLINICIAN: S Powers HISTORY FROM: patient REASON FOR VISIT: new consult   HISTORICAL  CHIEF COMPLAINT:  Chief Complaint  Patient presents with  . Eye Problem    eye twitching    HISTORY OF PRESENT ILLNESS:   32 year old right-handed female here for evaluation of left facial twitching. Patient has remote history of Bell's palsy at age 32 years old with resultant left sided facial weakness. One to 2 years ago she developed intermittent twitching of her left face. Over the past 3-4 months the left sided facial twitching has become constant and socially embarrassing. Patient was tried on gabapentin 100 mg 2 times per day without relief. She also felt sleepy on this dose of medication. She does not think she can take higher doses.  No new symptoms in her arms, legs, balance, vision speech or swallowing.  REVIEW OF SYSTEMS: Full 14 system review of systems performed and notable only for joint pain and aching muscles.  ALLERGIES: Allergies  Allergen Reactions  . Promethazine Hcl     Makes her jittery    HOME MEDICATIONS: Outpatient Prescriptions Prior to Visit  Medication Sig Dispense Refill  . HYDROcodone-acetaminophen (NORCO/VICODIN) 5-325 MG per tablet 1 to 2 tabs every 4 to 6 hours as needed for pain.  20 tablet  0   No facility-administered medications prior to visit.    PAST MEDICAL HISTORY: Past Medical History  Diagnosis Date  . No pertinent past medical history     PAST SURGICAL HISTORY: Past Surgical History  Procedure Laterality Date  . No past surgeries      FAMILY HISTORY: Family History  Problem Relation Age of Onset  . Anesthesia problems Neg Hx   . Diabetes Mother   . Sickle cell anemia Mother     SOCIAL HISTORY:  History   Social History  . Marital Status: Legally Separated    Spouse Name: N/A    Number of Children: 2  . Years of Education: 12+    Occupational History  . unemployed    Social History Main Topics  . Smoking status: Current Every Day Smoker -- 0.35 packs/day    Types: Cigarettes  . Smokeless tobacco: Not on file  . Alcohol Use: Yes     Comment: occasionally  . Drug Use: No  . Sexually Active: Yes    Birth Control/ Protection: Injection   Other Topics Concern  . Not on file   Social History Narrative   Pt lives at home with her children.   Caffeine Use- 2 cups of coffee daily     PHYSICAL EXAM  Filed Vitals:   11/14/12 1142  BP: 98/61  Pulse: 77  Temp: 99.2 F (37.3 C)  TempSrc: Oral  Height: 4\' 11"  (1.499 m)  Weight: 96 lb (43.545 kg)   Body mass index is 19.38 kg/(m^2).  GENERAL EXAM: Patient is in no distress  CARDIOVASCULAR: Regular rate and rhythm, no murmurs, no carotid bruits  NEUROLOGIC: MENTAL STATUS: awake, alert, language fluent, comprehension intact, naming intact CRANIAL NERVE: no papilledema on fundoscopic exam, pupils equal and reactive to light, visual fields full to confrontation, extraocular muscles intact, no nystagmus, facial sensation symmetric, DECR LEFT UPPER AND LOWER FACIAL STRENGTH, WITH INTERMITTENT LEFT HEMIFACIAL SPASM (MAINLY LEFT NASALIS, BUT SOME ORBICULARIS OCULI AND ORBICULARIS ORIS SPASM). Uvula midline, shoulder shrug symmetric, tongue midline. MOTOR: normal bulk and tone, full strength in the BUE, BLE SENSORY: normal and symmetric to light  touch, pinprick, temperature, vibration and proprioception COORDINATION: finger-nose-finger, fine finger movements normal REFLEXES: deep tendon reflexes present and symmetric GAIT/STATION: narrow based gait; able to walk on toes, heels and tandem; romberg is negative   DIAGNOSTIC DATA (LABS, IMAGING, TESTING) - I reviewed patient records, labs, notes, testing and imaging myself where available.  Lab Results  Component Value Date   WBC 9.3 10/13/2011   HGB 12.8 10/13/2011   HCT 36.5 10/13/2011   MCV 76.4* 10/13/2011    PLT 339 10/13/2011      Component Value Date/Time   NA 135 10/13/2011 1035   K 3.5 10/13/2011 1035   CL 99 10/13/2011 1035   CO2 25 10/13/2011 1035   GLUCOSE 115* 10/13/2011 1035   BUN 8 10/13/2011 1035   CREATININE 0.58 10/13/2011 1035   CALCIUM 9.9 10/13/2011 1035   PROT 7.7 10/13/2011 1035   ALBUMIN 4.3 10/13/2011 1035   AST 14 10/13/2011 1035   ALT 14 10/13/2011 1035   ALKPHOS 64 10/13/2011 1035   BILITOT 0.3 10/13/2011 1035   GFRNONAA >90 10/13/2011 1035   GFRAA >90 10/13/2011 1035   No results found for this basename: CHOL, HDL, LDLCALC, LDLDIRECT, TRIG, CHOLHDL   No results found for this basename: HGBA1C   No results found for this basename: VITAMINB12   No results found for this basename: TSH     ASSESSMENT AND PLAN  32 y.o. year old female  has a past medical history of No pertinent past medical history. 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 Patient has tried and failed gabapentin. We will try baclofen. If this doesn't work, then I will refer to my colleague Dr. Terrace Arabia for consideration of botulinum toxin injections for hemifacial spasm.  Meds ordered this encounter  Medications  . DISCONTD: gabapentin (NEURONTIN) 100 MG capsule    Sig: Take 100 mg by mouth 2 (two) times daily.  . baclofen (LIORESAL) 10 MG tablet    Sig: Take 0.5-1 tablets (5-10 mg total) by mouth 3 (three) times daily.    Dispense:  90 each    Refill:  3     Suanne Marker, MD 11/14/2012, 12:00 PM Certified in Neurology, Neurophysiology and Neuroimaging  [facility] [address] [place], [place] [address] [phone]

## 2012-11-14 NOTE — Patient Instructions (Signed)
Try baclofen 5 mg once at night, gradually increased to 5 mg 3 times per day. If you tolerate, can increase to 10 mg 3 times per day. Try this for at least 4-6 weeks.

## 2012-11-15 ENCOUNTER — Ambulatory Visit: Payer: Medicaid Other | Attending: Family Medicine

## 2012-12-18 ENCOUNTER — Telehealth: Payer: Self-pay | Admitting: Diagnostic Neuroimaging

## 2012-12-18 NOTE — Telephone Encounter (Signed)
Spoke to pt. Says med not working was told to call and schedule if not. Sched OV according to pt's schedule.

## 2012-12-27 ENCOUNTER — Ambulatory Visit (INDEPENDENT_AMBULATORY_CARE_PROVIDER_SITE_OTHER): Payer: Medicaid Other | Admitting: Diagnostic Neuroimaging

## 2012-12-27 ENCOUNTER — Encounter: Payer: Self-pay | Admitting: Diagnostic Neuroimaging

## 2012-12-27 VITALS — BP 101/68 | HR 68 | Temp 98.8°F | Ht 59.0 in | Wt 96.0 lb

## 2012-12-27 DIAGNOSIS — G518 Other disorders of facial nerve: Secondary | ICD-10-CM

## 2012-12-27 DIAGNOSIS — G5139 Clonic hemifacial spasm, unspecified: Secondary | ICD-10-CM

## 2012-12-27 DIAGNOSIS — Z8669 Personal history of other diseases of the nervous system and sense organs: Secondary | ICD-10-CM

## 2012-12-27 NOTE — Patient Instructions (Addendum)
I will refer you for botox injections.

## 2012-12-27 NOTE — Progress Notes (Signed)
[facility]  PATIENT: [name] DOB: June 03, 1981  REFERRING CLINICIAN: S Powers HISTORY FROM: patient REASON FOR VISIT: new consult   HISTORICAL  CHIEF COMPLAINT:  Chief Complaint  Patient presents with  . Follow-up    HISTORY OF PRESENT ILLNESS:   UPDATE 12/27/12: Since last visit, tried baclofen without results. No side effects. Wants to try botox. No sxs in arms or legs.  PRIOR HPI (11/14/12): 32 year old right-handed female here for evaluation of left facial twitching. Patient has remote history of Bell's palsy at age 11 years old with resultant left sided facial weakness. One to 2 years ago she developed intermittent twitching of her left face. Over the past 3-4 months the left sided facial twitching has become constant and socially embarrassing. Patient was tried on gabapentin 100 mg 2 times per day without relief. She also felt sleepy on this dose of medication. She does not think she can take higher doses.  No new symptoms in her arms, legs, balance, vision speech or swallowing.  REVIEW OF SYSTEMS: Full 14 system review of systems performed and notable only for nothing.  ALLERGIES: Allergies  Allergen Reactions  . Promethazine Hcl     Makes her jittery    HOME MEDICATIONS: Outpatient Prescriptions Prior to Visit  Medication Sig Dispense Refill  . HYDROcodone-acetaminophen (NORCO/VICODIN) 5-325 MG per tablet 1 to 2 tabs every 4 to 6 hours as needed for pain.  20 tablet  0  . baclofen (LIORESAL) 10 MG tablet Take 0.5-1 tablets (5-10 mg total) by mouth 3 (three) times daily.  90 each  3   No facility-administered medications prior to visit.    PAST MEDICAL HISTORY: Past Medical History  Diagnosis Date  . No pertinent past medical history     PAST SURGICAL HISTORY: Past Surgical History  Procedure Laterality Date  . No past surgeries      FAMILY HISTORY: Family History  Problem Relation Age of Onset  . Anesthesia problems Neg Hx    . Diabetes Mother   . Sickle cell anemia Mother     SOCIAL HISTORY:  History   Social History  . Marital Status: Legally Separated    Spouse Name: N/A    Number of Children: 2  . Years of Education: 12+   Occupational History  . unemployed    Social History Main Topics  . Smoking status: Current Every Day Smoker -- 0.35 packs/day    Types: Cigarettes  . Smokeless tobacco: Not on file  . Alcohol Use: Yes     Comment: occasionally  . Drug Use: No  . Sexually Active: Yes    Birth Control/ Protection: Injection   Other Topics Concern  . Not on file   Social History Narrative   Pt lives at home with her children.   Caffeine Use- 2 cups of coffee daily     PHYSICAL EXAM  Filed Vitals:   12/27/12 0957  BP: 101/68  Pulse: 68  Temp: 98.8 F (37.1 C)  TempSrc: Oral  Height: 4\' 11"  (1.499 m)  Weight: 96 lb (43.545 kg)   Body mass index is 19.38 kg/(m^2).  GENERAL EXAM: Patient is in no distress  CARDIOVASCULAR: Regular rate and rhythm, no murmurs  NEUROLOGIC: MENTAL STATUS: awake, alert, language fluent, comprehension intact, naming intact CRANIAL NERVE: pupils equal and reactive to light, visual fields full to confrontation, extraocular muscles intact, no nystagmus, facial sensation symmetric, DECR LEFT UPPER AND LOWER FACIAL STRENGTH, WITH INTERMITTENT LEFT HEMIFACIAL SPASM (MAINLY LEFT NASALIS,  BUT SOME ORBICULARIS OCULI AND ORBICULARIS ORIS SPASM). Uvula midline, shoulder shrug symmetric, tongue midline. MOTOR: normal bulk and tone, full strength in the BUE, BLE SENSORY: normal and symmetric to light touch REFLEXES: deep tendon reflexes present and symmetric GAIT/STATION: narrow based gai; romberg is negative   DIAGNOSTIC DATA (LABS, IMAGING, TESTING) - I reviewed patient records, labs, notes, testing and imaging myself where available.  Lab Results  Component Value Date   WBC 9.3 10/13/2011   HGB 12.8 10/13/2011   HCT 36.5 10/13/2011   MCV 76.4*  10/13/2011   PLT 339 10/13/2011      Component Value Date/Time   NA 135 10/13/2011 1035   K 3.5 10/13/2011 1035   CL 99 10/13/2011 1035   CO2 25 10/13/2011 1035   GLUCOSE 115* 10/13/2011 1035   BUN 8 10/13/2011 1035   CREATININE 0.58 10/13/2011 1035   CALCIUM 9.9 10/13/2011 1035   PROT 7.7 10/13/2011 1035   ALBUMIN 4.3 10/13/2011 1035   AST 14 10/13/2011 1035   ALT 14 10/13/2011 1035   ALKPHOS 64 10/13/2011 1035   BILITOT 0.3 10/13/2011 1035   GFRNONAA >90 10/13/2011 1035   GFRAA >90 10/13/2011 1035   No results found for this basename: CHOL,  HDL,  LDLCALC,  LDLDIRECT,  TRIG,  CHOLHDL   No results found for this basename: HGBA1C   No results found for this basename: VITAMINB12   No results found for this basename: TSH     ASSESSMENT AND PLAN  32 y.o. year old female  has a past medical history of No pertinent past medical history. 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 Patient has tried and failed gabapentin and baclofen. I will refer to my colleague Dr. Terrace Arabia for consideration of botulinum toxin injections for hemifacial spasm.    Suanne Marker, MD 12/27/2012, 10:14 AM Certified in Neurology, Neurophysiology and Neuroimaging  [facility] [address] [place], [place] [address] [phone]

## 2013-02-07 ENCOUNTER — Emergency Department (HOSPITAL_COMMUNITY)
Admission: EM | Admit: 2013-02-07 | Discharge: 2013-02-07 | Disposition: A | Discharge status: Home / Self Care | Payer: No Typology Code available for payment source | Attending: Emergency Medicine | Admitting: Emergency Medicine

## 2013-02-07 ENCOUNTER — Emergency Department (HOSPITAL_COMMUNITY): Payer: No Typology Code available for payment source

## 2013-02-07 ENCOUNTER — Encounter (HOSPITAL_COMMUNITY): Payer: Self-pay | Admitting: Emergency Medicine

## 2013-02-07 DIAGNOSIS — Y9389 Activity, other specified: Secondary | ICD-10-CM | POA: Insufficient documentation

## 2013-02-07 DIAGNOSIS — M546 Pain in thoracic spine: Secondary | ICD-10-CM

## 2013-02-07 DIAGNOSIS — Y92410 Unspecified street and highway as the place of occurrence of the external cause: Secondary | ICD-10-CM | POA: Insufficient documentation

## 2013-02-07 DIAGNOSIS — S298XXA Other specified injuries of thorax, initial encounter: Secondary | ICD-10-CM | POA: Insufficient documentation

## 2013-02-07 DIAGNOSIS — S139XXA Sprain of joints and ligaments of unspecified parts of neck, initial encounter: Secondary | ICD-10-CM | POA: Insufficient documentation

## 2013-02-07 DIAGNOSIS — Z8739 Personal history of other diseases of the musculoskeletal system and connective tissue: Secondary | ICD-10-CM | POA: Insufficient documentation

## 2013-02-07 DIAGNOSIS — F17200 Nicotine dependence, unspecified, uncomplicated: Secondary | ICD-10-CM | POA: Insufficient documentation

## 2013-02-07 DIAGNOSIS — S161XXA Strain of muscle, fascia and tendon at neck level, initial encounter: Secondary | ICD-10-CM

## 2013-02-07 DIAGNOSIS — J45909 Unspecified asthma, uncomplicated: Secondary | ICD-10-CM | POA: Insufficient documentation

## 2013-02-07 HISTORY — DX: Scoliosis, unspecified: M41.9

## 2013-02-07 HISTORY — DX: Unspecified asthma, uncomplicated: J45.909

## 2013-02-07 MED ORDER — TRAMADOL HCL 50 MG PO TABS: 50.0000 mg | ORAL_TABLET | Freq: Four times a day (QID) | ORAL | Status: DC | PRN

## 2013-02-07 MED ORDER — METHOCARBAMOL 500 MG PO TABS: 500.0000 mg | ORAL_TABLET | Freq: Two times a day (BID) | ORAL | Status: DC

## 2013-02-07 MED ORDER — NAPROXEN 375 MG PO TABS: 375.0000 mg | ORAL_TABLET | Freq: Two times a day (BID) | ORAL | Status: DC
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
## 2013-02-08 NOTE — ED Provider Notes (Signed)
Medical screening examination/treatment/procedure(s) were performed by non-physician practitioner and as supervising physician I was immediately available for consultation/collaboration.   Carleene Cooper III, MD 02/08/13 [phone]

## 2013-02-21 ENCOUNTER — Ambulatory Visit (INDEPENDENT_AMBULATORY_CARE_PROVIDER_SITE_OTHER): Payer: Medicaid Other | Admitting: Neurology

## 2013-02-21 ENCOUNTER — Encounter: Payer: Self-pay | Admitting: Neurology

## 2013-02-21 VITALS — BP 93/59 | HR 97 | Ht 59.0 in | Wt 94.0 lb

## 2013-02-21 DIAGNOSIS — G5139 Clonic hemifacial spasm, unspecified: Secondary | ICD-10-CM

## 2013-02-21 DIAGNOSIS — G518 Other disorders of facial nerve: Secondary | ICD-10-CM

## 2013-02-21 MED ORDER — INCOBOTULINUMTOXINA 50 UNITS IM SOLR
50.0000 [IU] | Freq: Once | INTRAMUSCULAR | Status: AC
  Administered 2013-02-21: 50 [IU] via INTRAMUSCULAR

## 2013-02-21 NOTE — Progress Notes (Signed)
[facility]  [name]: [name] DOB: [birth year]  REFERRING CLINICIAN: S Powers HISTORY FROM: [name] REASON FOR VISIT: new consult   HISTORICAL  [name] is a 33 year old right-handed female here for evaluation of left facial twitching. She is referred by Dr. Marjory Lies for possible EMG guided botulinumtoxin injection for her left facial muscle spams.  She has remote history of left Bell's palsy at age 73 years old with resultant left sided facial weakness. One to 2 years ago, since 2012,  she developed intermittent twitching of her left face. Over the past 3-4 months the left sided facial twitching has become constant and socially embarrassing. [name] was tried on gabapentin 100 mg 2 times per day without relief. She also felt sleepy on this dose of medication. She does not think she can take higher doses. She has tried baclofen, without improvement of her symptoms.  She denies symptoms in her arms, legs, balance, vision speech or swallowing.   She has left facial twitching through out the day, it took up 50% of awake time, her left facial twitching can to the point of affecting her reading.   REVIEW OF SYSTEMS: Full 14 system review of systems performed and notable only for nothing.  ALLERGIES: Allergies  Allergen Reactions  . Promethazine Hcl     Makes her jittery    HOME MEDICATIONS: Outpatient Prescriptions Prior to Visit  Medication Sig Dispense Refill  . HYDROcodone-acetaminophen (NORCO/VICODIN) 5-325 MG per tablet 1 to 2 tabs every 4 to 6 hours as needed for pain.  20 tablet  0  . methocarbamol (ROBAXIN) 500 MG tablet Take 1 tablet (500 mg total) by mouth 2 (two) times daily.  20 tablet  0  . naproxen (NAPROSYN) 375 MG tablet Take 1 tablet (375 mg total) by mouth 2 (two) times daily.  20 tablet  0  . traMADol (ULTRAM) 50 MG tablet Take 1 tablet (50 mg total) by mouth every 6 (six) hours as needed for pain.  15 tablet  0   No  facility-administered medications prior to visit.    PAST MEDICAL HISTORY: Past Medical History  Diagnosis Date  . Scoliosis   . Asthma     PAST SURGICAL HISTORY: Past Surgical History  Procedure Laterality Date  . Hand surgery      FAMILY HISTORY: Family History  Problem Relation Age of Onset  . Anesthesia problems Neg Hx   . Diabetes Mother   . Sickle cell anemia Mother     SOCIAL HISTORY:  History   Social History  . Marital Status: Legally Separated    Spouse Name: N/A    Number of Children: 2  . Years of Education: 12+   Occupational History  . unemployed    Social History Main Topics  . Smoking status: Current Every Day Smoker -- 0.35 packs/day    Types: Cigarettes  . Smokeless tobacco: Never Used  . Alcohol Use: 1.2 oz/week    2 Cans of beer per week     Comment: occasionally  . Drug Use: No  . Sexually Active: Yes    Birth Control/ Protection: Injection   Other Topics Concern  . Not on file   Social History Narrative   Pt lives at home with her children. [name] is sep rated . [name] is not working at this time.   Caffeine Use- 2 cups of coffee daily.   Right handed.     PHYSICAL EXAM  Filed Vitals:   02/21/13 1534  BP: 93/59  Pulse: 97  Height: 4\' 11"  (1.499 m)  Weight: 94 lb (42.638 kg)   Body mass index is 18.98 kg/(m^2).  PHYSICAL EXAMINATOINS:  Generalized: In no acute distress  Neck: Supple, no carotid bruits   Cardiac: Regular rate rhythm  Pulmonary: Clear to auscultation bilaterally  Musculoskeletal: No deformity  Neurological examination  Mentation: Alert oriented to time, place, history taking, and causual conversation  Cranial nerve II-XII: Pupils were equal round reactive to light extraocular movements were full, visual field were full on confrontational test. facial sensation was normal.  She has occasional left facial muscle twitching, involving left orbicularis oculi and left cheek area.  hearing was intact  to finger rubbing bilaterally. Uvula tongue midline.  head turning and shoulder shrug and were normal and symmetric.Tongue protrusion into cheek strength was normal.  Motor: normal tone, bulk and strength.  Sensory: Intact to fine touch, pinprick, preserved vibratory sensation, and proprioception at toes.  Coordination: Normal finger to nose, heel-to-shin bilaterally there was no truncal ataxia  Gait: Rising up from seated position without assistance, normal stance, without trunk ataxia, moderate stride, good arm swing, smooth turning, able to perform tiptoe, and heel walking without difficulty.   Romberg signs: Negative  Deep tendon reflexes: Brachioradialis 2/2, biceps 2/2, triceps 2/2, patellar 2/2, Achilles 2/2, plantar responses were flexor bilaterally.   DIAGNOSTIC DATA (LABS, IMAGING, TESTING) - I reviewed [name] records, labs, notes, testing and imaging myself where available.  Lab Results  Component Value Date   WBC 9.3 10/13/2011   HGB 12.8 10/13/2011   HCT 36.5 10/13/2011   MCV 76.4* 10/13/2011   PLT 339 10/13/2011      Component Value Date/Time   NA 135 10/13/2011 1035   K 3.5 10/13/2011 1035   CL 99 10/13/2011 1035   CO2 25 10/13/2011 1035   GLUCOSE 115* 10/13/2011 1035   BUN 8 10/13/2011 1035   CREATININE 0.58 10/13/2011 1035   CALCIUM 9.9 10/13/2011 1035   PROT 7.7 10/13/2011 1035   ALBUMIN 4.3 10/13/2011 1035   AST 14 10/13/2011 1035   ALT 14 10/13/2011 1035   ALKPHOS 64 10/13/2011 1035   BILITOT 0.3 10/13/2011 1035   GFRNONAA >90 10/13/2011 1035   GFRAA >90 10/13/2011 1035    ASSESSMENT AND PLAN  32 y.o. year old female  has a past medical history of left Bell's palsy now with left hemifacial spasm.  She has tried and failed gabapentin and baclofen. After discuss with [name], we decided to proceed with EMG guided Xeomin injection, but after Xeomin was dissolved, she decided not to proceed with it.  She is to continue follow up with Dr. Kenyon Ana, Bronda Alfred.  M.D. [facility] [address] [place], [place] [address] [phone]

## 2013-05-27 ENCOUNTER — Inpatient Hospital Stay (HOSPITAL_COMMUNITY)
Admission: AD | Admit: 2013-05-27 | Discharge: 2013-05-27 | Disposition: A | Discharge status: Home / Self Care | Payer: Medicaid Other | Source: Ambulatory Visit | Attending: Obstetrics & Gynecology | Admitting: Obstetrics & Gynecology

## 2013-05-27 ENCOUNTER — Encounter (HOSPITAL_COMMUNITY): Payer: Self-pay

## 2013-05-27 DIAGNOSIS — N938 Other specified abnormal uterine and vaginal bleeding: Secondary | ICD-10-CM | POA: Insufficient documentation

## 2013-05-27 DIAGNOSIS — N949 Unspecified condition associated with female genital organs and menstrual cycle: Secondary | ICD-10-CM | POA: Insufficient documentation

## 2013-05-27 HISTORY — DX: Reserved for concepts with insufficient information to code with codable children: IMO0002

## 2013-05-27 LAB — WET PREP, GENITAL: Yeast Wet Prep HPF POC: NONE SEEN

## 2013-05-27 LAB — POCT PREGNANCY, URINE: Preg Test, Ur: NEGATIVE

## 2013-05-27 NOTE — MAU Provider Note (Signed)
History     CSN: [id number]  Arrival date and time: 05/27/13 2024   None     Chief Complaint  Patient presents with  . Amenorrhea   HPI  Pt is a [id number] here with report of irregular cycles x 1.5 months.  Last normal menstrual period was on Patient's last menstrual period was 04/06/2013.   Negative history of irregular cycles.  After the normal period in September then had some spotting the end of September for approximately 3 days.   Then spotting returned at the end of October. Had negative HPT at home. No current birth control use, +sexually active.      Past Medical History  Diagnosis Date  . Scoliosis   . Asthma   . Abnormal Pap smear 2011    repeat WNL    Past Surgical History  Procedure Laterality Date  . Hand surgery Left     Family History  Problem Relation Age of Onset  . Anesthesia problems Neg Hx   . Diabetes Mother   . Sickle cell anemia Mother     History  Substance Use Topics  . Smoking status: Current Every Day Smoker -- 0.35 packs/day    Types: Cigarettes  . Smokeless tobacco: Never Used  . Alcohol Use: 1.2 oz/week    2 Cans of beer per week     Comment: occasionally    Allergies:  Allergies  Allergen Reactions  . Promethazine Hcl     Makes her jittery    Prescriptions prior to admission  Medication Sig Dispense Refill  . gabapentin (NEURONTIN) 100 MG capsule Take 100 mg by mouth 2 (two) times daily.        Review of Systems  Constitutional: Negative for weight loss and malaise/fatigue.  Gastrointestinal: Positive for nausea. Negative for vomiting and abdominal pain.  Genitourinary: Negative.   All other systems reviewed and are negative.   Physical Exam   Blood pressure 103/59, pulse 97, temperature 98.5 F (36.9 C), temperature source Oral, resp. rate 16, height 4\' 11"  (1.499 m), weight 41.187 kg (90 lb 12.8 oz), last menstrual period 04/06/2013, SpO2 100.00%.  Physical Exam  Constitutional: She is oriented to person,  place, and time. She appears well-developed and well-nourished. No distress.  HENT:  Head: Normocephalic.  Eyes: Pupils are equal, round, and reactive to light.  Neck: Normal range of motion. Neck supple.  Cardiovascular: Normal rate, regular rhythm and normal heart sounds.   Respiratory: Effort normal and breath sounds normal.  GI: Soft. There is no tenderness.  Genitourinary: Uterus normal. Right adnexum displays no mass. Left adnexum displays no mass. Vaginal discharge (white, slightly frothy) found.  Negative cervical motion tenderness  Neurological: She is alert and oriented to person, place, and time. She has normal reflexes.  Skin: Skin is warm and dry.    MAU Course  Procedures Results for orders placed during the hospital encounter of 05/27/13 (from the past 24 hour(s))  POCT PREGNANCY, URINE     Status: None   Collection Time    05/27/13  8:40 PM      Result Value Range   Preg Test, Ur NEGATIVE  NEGATIVE  WET PREP, GENITAL     Status: Abnormal   Collection Time    05/27/13  9:29 PM      Result Value Range   Yeast Wet Prep HPF POC NONE SEEN  NONE SEEN   Trich, Wet Prep NONE SEEN  NONE SEEN   Clue Cells Wet Prep HPF POC  FEW (*) NONE SEEN   WBC, Wet Prep HPF POC FEW (*) NONE SEEN     Assessment and Plan  Dysfunctional Uterine Bleeding  Plan: Discharge to home Provided reassurance; usually returns to normal within a couple of cycles Follow-up in [facility] in 4 weeks.    [facility] 05/27/2013, 9:05 PM

## 2013-05-27 NOTE — MAU Note (Signed)
Pt states had an irregular period the beginning of September, then some spotting the end of September. Had some spotting the end of October. Had negative HPT at home. No birth control.

## 2013-08-02 NOTE — L&D Delivery Note (Signed)
.  vagDelivery Note At 8:19 PM a viable female was delivered via Vaginal, Spontaneous Delivery (Presentation: LOA;  ).  APGAR: 8, 9; weight 4 lb 11.3 oz (2135 g).   Placenta status: Intact, Spontaneous.  Cord: 3 vessels with the following complications: None.   Anesthesia: None  Episiotomy: None Lacerations: None Suture Repair: n/a Est. Blood Loss (mL): 250  Mom to postpartum.  Baby to Couplet care / Skin to Skin.  CRESENZO-DISHMAN,Darianna Amy 03/21/2014, 9:32 PM  Delivery by Shearon BaloKarim Ghanem, PA-S under my supervision

## 2013-08-04 ENCOUNTER — Inpatient Hospital Stay (HOSPITAL_COMMUNITY)
Admission: AD | Admit: 2013-08-04 | Discharge: 2013-08-04 | Disposition: A | Discharge status: Home / Self Care | Payer: Medicaid Other | Source: Ambulatory Visit | Attending: Family Medicine | Admitting: Family Medicine

## 2013-08-04 ENCOUNTER — Inpatient Hospital Stay (HOSPITAL_COMMUNITY): Payer: Medicaid Other

## 2013-08-04 ENCOUNTER — Encounter (HOSPITAL_COMMUNITY): Payer: Self-pay | Admitting: *Deleted

## 2013-08-04 DIAGNOSIS — O99330 Smoking (tobacco) complicating pregnancy, unspecified trimester: Secondary | ICD-10-CM | POA: Insufficient documentation

## 2013-08-04 DIAGNOSIS — Z3201 Encounter for pregnancy test, result positive: Secondary | ICD-10-CM

## 2013-08-04 DIAGNOSIS — R109 Unspecified abdominal pain: Secondary | ICD-10-CM | POA: Insufficient documentation

## 2013-08-04 DIAGNOSIS — O210 Mild hyperemesis gravidarum: Secondary | ICD-10-CM | POA: Insufficient documentation

## 2013-08-04 DIAGNOSIS — O219 Vomiting of pregnancy, unspecified: Secondary | ICD-10-CM

## 2013-08-04 DIAGNOSIS — O26899 Other specified pregnancy related conditions, unspecified trimester: Secondary | ICD-10-CM

## 2013-08-04 LAB — URINE MICROSCOPIC-ADD ON

## 2013-08-04 LAB — WET PREP, GENITAL
TRICH WET PREP: NONE SEEN
Yeast Wet Prep HPF POC: NONE SEEN

## 2013-08-04 LAB — URINALYSIS, ROUTINE W REFLEX MICROSCOPIC
BILIRUBIN URINE: NEGATIVE
GLUCOSE, UA: NEGATIVE mg/dL
Ketones, ur: NEGATIVE mg/dL
Leukocytes, UA: NEGATIVE
Nitrite: NEGATIVE
Protein, ur: NEGATIVE mg/dL
SPECIFIC GRAVITY, URINE: 1.02 (ref 1.005–1.030)
UROBILINOGEN UA: 0.2 mg/dL (ref 0.0–1.0)
pH: 7 (ref 5.0–8.0)

## 2013-08-04 LAB — CBC
HCT: 34.7 % — ABNORMAL LOW (ref 36.0–46.0)
HEMOGLOBIN: 12.3 g/dL (ref 12.0–15.0)
MCH: 27.4 pg (ref 26.0–34.0)
MCHC: 35.4 g/dL (ref 30.0–36.0)
MCV: 77.3 fL — ABNORMAL LOW (ref 78.0–100.0)
Platelets: 317 10*3/uL (ref 150–400)
RBC: 4.49 MIL/uL (ref 3.87–5.11)
RDW: 13.5 % (ref 11.5–15.5)
WBC: 8.2 10*3/uL (ref 4.0–10.5)

## 2013-08-04 LAB — POCT PREGNANCY, URINE: Preg Test, Ur: POSITIVE — AB

## 2013-08-04 LAB — HCG, QUANTITATIVE, PREGNANCY: HCG, BETA CHAIN, QUANT, S: 27 m[IU]/mL — AB (ref <5)

## 2013-08-04 MED ORDER — ONDANSETRON 8 MG PO TBDP
8.0000 mg | ORAL_TABLET | ORAL | Status: AC
  Administered 2013-08-04: 8 mg via ORAL
  Filled 2013-08-04: qty 1

## 2013-08-04 MED ORDER — ONDANSETRON HCL 4 MG PO TABS: 4.0000 mg | ORAL_TABLET | Freq: Every day | ORAL | Status: DC | PRN

## 2013-08-04 NOTE — MAU Provider Note (Signed)
Chart reviewed and agree with management and plan.  

## 2013-08-04 NOTE — MAU Note (Signed)
Pt presents with complaints of 2 positive pregnancy tests at home and nausea. Denies any bleeding or cramping

## 2013-08-04 NOTE — MAU Note (Signed)
After discussing abdominal cramping with pt she states that she has had some abdominal cramping more like her menstrual cycle is about to start.

## 2013-08-04 NOTE — MAU Provider Note (Signed)
Chief Complaint: Possible Pregnancy and Morning Sickness   First Provider Initiated Contact with Patient 08/04/13 1159     SUBJECTIVE HPI: [name] is a 33 y.o. [id number] at [redacted]w[redacted]d by LMP who presents to maternity admissions reporting nausea with daily vomiting and abdominal cramping x1 week.  She had positive home pregnancy test that was faint yesterday.  She denies vaginal bleeding, vaginal itching/burning, urinary symptoms, h/a, dizziness, or fever/chills.    Past Medical History  Diagnosis Date  . Scoliosis   . Asthma   . Abnormal Pap smear 2011    repeat WNL; cryo 20011   Past Surgical History  Procedure Laterality Date  . Hand surgery Left    History   Social History  . Marital Status: Legally Separated    Spouse Name: N/A    Number of Children: 2  . Years of Education: 12+   Occupational History  . unemployed    Social History Main Topics  . Smoking status: Current Every Day Smoker -- 0.35 packs/day    Types: Cigarettes  . Smokeless tobacco: Never Used  . Alcohol Use: 1.2 oz/week    2 Cans of beer per week     Comment: occasionally  . Drug Use: No  . Sexual Activity: Yes    Birth Control/ Protection: Condom     Comment: stopped taking BCP 11/2012   Other Topics Concern  . Not on file   Social History Narrative   Pt lives at home with her children. Patient is sep rated . Patient is not working at this time.   Caffeine Use- 2 cups of coffee daily.   Right handed.   No current facility-administered medications on file prior to encounter.   No current outpatient prescriptions on file prior to encounter.   Allergies  Allergen Reactions  . Promethazine Hcl Palpitations    Makes her jittery    ROS: Pertinent items in HPI  OBJECTIVE Blood pressure 119/72, pulse 105, temperature 98.3 F (36.8 C), temperature source Oral, resp. rate 18, height 4\' 11"  (1.499 m), weight 43.092 kg (95 lb), last menstrual period 07/12/2013. GENERAL: Well-developed,  well-nourished female in no acute distress.  HEENT: Normocephalic HEART: normal rate RESP: normal effort ABDOMEN: Soft, non-tender EXTREMITIES: Nontender, no edema NEURO: Alert and oriented Pelvic exam: Cervix pink, visually closed, without lesion, scant white creamy discharge, vaginal walls and external genitalia normal Bimanual exam: Cervix 0/long/high, firm, anterior, neg CMT, uterus nontender, nonenlarged, adnexa without tenderness, enlargement, or mass  LAB RESULTS Results for orders placed during the hospital encounter of 08/04/13 (from the past 24 hour(s))  URINALYSIS, ROUTINE W REFLEX MICROSCOPIC     Status: Abnormal   Collection Time    08/04/13 11:00 AM      Result Value Range   Color, Urine YELLOW  YELLOW   APPearance CLEAR  CLEAR   Specific Gravity, Urine 1.020  1.005 - 1.030   pH 7.0  5.0 - 8.0   Glucose, UA NEGATIVE  NEGATIVE mg/dL   Hgb urine dipstick MODERATE (*) NEGATIVE   Bilirubin Urine NEGATIVE  NEGATIVE   Ketones, ur NEGATIVE  NEGATIVE mg/dL   Protein, ur NEGATIVE  NEGATIVE mg/dL   Urobilinogen, UA 0.2  0.0 - 1.0 mg/dL   Nitrite NEGATIVE  NEGATIVE   Leukocytes, UA NEGATIVE  NEGATIVE  URINE MICROSCOPIC-ADD ON     Status: Abnormal   Collection Time    08/04/13 11:00 AM      Result Value Range   Squamous Epithelial / LPF  FEW (*) RARE   RBC / HPF 3-6  <3 RBC/hpf   Urine-Other MUCOUS PRESENT    CBC     Status: Abnormal   Collection Time    08/04/13 11:58 AM      Result Value Range   WBC 8.2  4.0 - 10.5 K/uL   RBC 4.49  3.87 - 5.11 MIL/uL   Hemoglobin 12.3  12.0 - 15.0 g/dL   HCT 40.934.7 (*) 81.136.0 - 91.446.0 %   MCV 77.3 (*) 78.0 - 100.0 fL   MCH 27.4  26.0 - 34.0 pg   MCHC 35.4  30.0 - 36.0 g/dL   RDW 78.213.5  95.611.5 - 21.315.5 %   Platelets 317  150 - 400 K/uL  HCG, QUANTITATIVE, PREGNANCY     Status: Abnormal   Collection Time    08/04/13 11:58 AM      Result Value Range   hCG, Beta Chain, Quant, S 27 (*) <5 mIU/mL  WET PREP, GENITAL     Status: Abnormal    Collection Time    08/04/13 12:20 PM      Result Value Range   Yeast Wet Prep HPF POC NONE SEEN  NONE SEEN   Trich, Wet Prep NONE SEEN  NONE SEEN   Clue Cells Wet Prep HPF POC FEW (*) NONE SEEN   WBC, Wet Prep HPF POC FEW (*) NONE SEEN    IMAGING [place] Ob Comp Less 14 Wks  08/04/2013   CLINICAL DATA:  abdom pain, 1342w2d by unsure LMP  EXAM: TRANSVAGINAL OB ULTRASOUND; OBSTETRIC <14 WK ULTRASOUND  TECHNIQUE: Transvaginal ultrasound was performed for complete evaluation of the gestation as well as the maternal uterus, adnexal regions, and pelvic cul-de-sac.  COMPARISON:  None.  FINDINGS: Intrauterine gestational sac: None visualized.  Maternal uterus/adnexae: The uterus is normal in size and echogenicity. The endometrium measures 7.4 mm. The right ovary is normal in size measuring 3.1 x 2 x 3 cm. The left ovary is normal in size and echogenicity measuring 3.5 x 2.4 x 2.3 cm. There is no adnexal mass. There is no pelvic free fluid.  IMPRESSION: No intrauterine pregnancy identified. Given the elevated HCG, differential diagnosis includes a pregnancy too early to detect versus missed abortion versus ectopic pregnancy. Recommend clinical correlation, serial quantitative beta HCGs, ectopic precautions, and followup ultrasound as clinically indicated.   Electronically Signed   By: Elige KoHetal  Patel   On: 08/04/2013 14:05   [place] Ob Transvaginal  08/04/2013   CLINICAL DATA:  abdom pain, 3442w2d by unsure LMP  EXAM: TRANSVAGINAL OB ULTRASOUND; OBSTETRIC <14 WK ULTRASOUND  TECHNIQUE: Transvaginal ultrasound was performed for complete evaluation of the gestation as well as the maternal uterus, adnexal regions, and pelvic cul-de-sac.  COMPARISON:  None.  FINDINGS: Intrauterine gestational sac: None visualized.  Maternal uterus/adnexae: The uterus is normal in size and echogenicity. The endometrium measures 7.4 mm. The right ovary is normal in size measuring 3.1 x 2 x 3 cm. The left ovary is normal in size and echogenicity measuring  3.5 x 2.4 x 2.3 cm. There is no adnexal mass. There is no pelvic free fluid.  IMPRESSION: No intrauterine pregnancy identified. Given the elevated HCG, differential diagnosis includes a pregnancy too early to detect versus missed abortion versus ectopic pregnancy. Recommend clinical correlation, serial quantitative beta HCGs, ectopic precautions, and followup ultrasound as clinically indicated.   Electronically Signed   By: Elige KoHetal  Patel   On: 08/04/2013 14:05    ASSESSMENT 1. Positive blood pregnancy test  2. Abdominal pain in pregnancy   3. Nausea/vomiting in pregnancy     PLAN Discharge home Zofran 4 mg PO Q 8 hours PRN nausea Return in 48 hours for repeat quant hcg Return sooner as needed    Medication List    STOP taking these medications       ibuprofen 200 MG tablet  Commonly known as:  ADVIL,MOTRIN      TAKE these medications       ondansetron 4 MG tablet  Commonly known as:  ZOFRAN  Take 1 tablet (4 mg total) by mouth daily as needed for nausea or vomiting.       Follow-up Information   Follow up with [facility]. (Return to MAU on Monday for follow up labs.  Return sooner as needed.)    Contact information:   [id number] [place] [address] [phone]      Sharen Counter Certified Nurse-Midwife 08/04/2013  2:25 PM

## 2013-08-04 NOTE — Discharge Instructions (Signed)
Nausea/Vomiting of Pregnancy Morning sickness is when you feel sick to your stomach (nauseous) during pregnancy. This nauseous feeling may or may not come with vomiting. It often occurs in the morning but can be a problem any time of day. Morning sickness is most common during the first trimester, but it may continue throughout pregnancy. While morning sickness is unpleasant, it is usually harmless unless you develop severe and continual vomiting (hyperemesis gravidarum). This condition requires more intense treatment.  CAUSES  The cause of morning sickness is not completely known but seems to be related to normal hormonal changes that occur in pregnancy. RISK FACTORS You are at greater risk if you:  Experienced nausea or vomiting before your pregnancy.  Had morning sickness during a previous pregnancy.  Are pregnant with more than one baby, such as twins. TREATMENT  Do not use any medicines (prescription, over-the-counter, or herbal) for morning sickness without first talking to your health care provider. Your health care provider may prescribe or recommend:  Vitamin B6 supplements.  Anti-nausea medicines.  The herbal medicine ginger. HOME CARE INSTRUCTIONS   Only take over-the-counter or prescription medicines as directed by your health care provider.  Taking multivitamins before getting pregnant can prevent or decrease the severity of morning sickness in most women.   Eat a piece of dry toast or unsalted crackers before getting out of bed in the morning.   Eat five or six small meals a day.   Eat dry and bland foods (rice, baked potato). Foods high in carbohydrates are often helpful.  Do not drink liquids with your meals. Drink liquids between meals.   Avoid greasy, fatty, and spicy foods.   Get someone to cook for you if the smell of any food causes nausea and vomiting.   If you feel nauseous after taking prenatal vitamins, take the vitamins at night or with a  snack.  Snack on protein foods (nuts, yogurt, cheese) between meals if you are hungry.   Eat unsweetened gelatins for desserts.   Wearing an acupressure wristband (worn for sea sickness) may be helpful.   Acupuncture may be helpful.   Do not smoke.   Get a humidifier to keep the air in your house free of odors.   Get plenty of fresh air. SEEK MEDICAL CARE IF:   Your home remedies are not working, and you need medicine.  You feel dizzy or lightheaded.  You are losing weight. SEEK IMMEDIATE MEDICAL CARE IF:   You have persistent and uncontrolled nausea and vomiting.  You pass out (faint). Document Released: 09/09/2006 Document Revised: 03/21/2013 Document Reviewed: 01/03/2013 [facility] Patient Information 2014 [place], [place]. Abdominal Pain During Pregnancy Abdominal discomfort is common in pregnancy. Most of the time, it does not cause harm. There are many causes of abdominal pain. Some causes are more serious than others. Some of the causes of abdominal pain in pregnancy are easily diagnosed. Occasionally, the diagnosis takes time to understand. Other times, the cause is not determined. Abdominal pain can be a sign that something is very wrong with the pregnancy, or the pain may have nothing to do with the pregnancy at all. For this reason, always tell your caregiver if you have any abdominal discomfort. CAUSES Common and harmless causes of abdominal pain include:  Constipation.  Excess gas and bloating.  Round ligament pain. This is pain that is felt in the folds of the groin.  The position the baby or placenta is in.  Baby kicks.  Braxton-Hicks contractions. These are  mild contractions that do not cause cervical dilation. Serious causes of abdominal pain include:  Ectopic pregnancy. This happens when a fertilized egg implants outside of the uterus.  Miscarriage.  Preterm labor. This is when labor starts at less than 37 weeks of pregnancy.  Placental  abruption. This is when the placenta partially or completely separates from the uterus.  Preeclampsia. This is often associated with high blood pressure and has been referred to as "toxemia in pregnancy."  Uterine or amniotic fluid infections. Causes unrelated to pregnancy include:  Urinary tract infection.  Gallbladder stones or inflammation.  Hepatitis or other liver illness.  Intestinal problems, stomach flu, food poisoning, or ulcer.  Appendicitis.  Kidney (renal) stones.  Kidney infection (pylonephritis). HOME CARE INSTRUCTIONS  For mild pain:  Do not have sexual intercourse or put anything in your vagina until your symptoms go away completely.  Get plenty of rest until your pain improves. If your pain does not improve in 1 hour, call your caregiver.  Drink clear fluids if you feel nauseous. Avoid solid food as long as you are uncomfortable or nauseous.  Only take medicine as directed by your caregiver.  Keep all follow-up appointments with your caregiver. SEEK IMMEDIATE MEDICAL CARE IF:  You are bleeding, leaking fluid, or passing tissue from the vagina.  You have increasing pain or cramping.  You have persistent vomiting.  You have painful or bloody urination.  You have a fever.  You notice a decrease in your baby's movements.  You have extreme weakness or feel faint.  You have shortness of breath, with or without abdominal pain.  You develop a severe headache with abdominal pain.  You have abnormal vaginal discharge with abdominal pain.  You have persistent diarrhea.  You have abdominal pain that continues even after rest, or gets worse. MAKE SURE YOU:   Understand these instructions.  Will watch your condition.  Will get help right away if you are not doing well or get worse. Document Released: 07/19/2005 Document Revised: 10/11/2011 Document Reviewed: 02/15/2013 [facility] Patient Information 2014 [place].

## 2013-08-06 LAB — GC/CHLAMYDIA PROBE AMP
CT Probe RNA: NEGATIVE
GC Probe RNA: NEGATIVE

## 2013-09-02 ENCOUNTER — Inpatient Hospital Stay (HOSPITAL_COMMUNITY)
Admission: AD | Admit: 2013-09-02 | Discharge: 2013-09-02 | Disposition: A | Discharge status: Home / Self Care | Payer: Medicaid Other | Source: Ambulatory Visit | Attending: Family Medicine | Admitting: Family Medicine

## 2013-09-02 ENCOUNTER — Encounter (HOSPITAL_COMMUNITY): Payer: Self-pay | Admitting: *Deleted

## 2013-09-02 DIAGNOSIS — N390 Urinary tract infection, site not specified: Secondary | ICD-10-CM | POA: Insufficient documentation

## 2013-09-02 DIAGNOSIS — R42 Dizziness and giddiness: Secondary | ICD-10-CM | POA: Insufficient documentation

## 2013-09-02 DIAGNOSIS — O210 Mild hyperemesis gravidarum: Secondary | ICD-10-CM | POA: Insufficient documentation

## 2013-09-02 DIAGNOSIS — O2390 Unspecified genitourinary tract infection in pregnancy, unspecified trimester: Secondary | ICD-10-CM | POA: Insufficient documentation

## 2013-09-02 DIAGNOSIS — R112 Nausea with vomiting, unspecified: Secondary | ICD-10-CM

## 2013-09-02 DIAGNOSIS — O99330 Smoking (tobacco) complicating pregnancy, unspecified trimester: Secondary | ICD-10-CM | POA: Insufficient documentation

## 2013-09-02 LAB — URINALYSIS, ROUTINE W REFLEX MICROSCOPIC
BILIRUBIN URINE: NEGATIVE
GLUCOSE, UA: NEGATIVE mg/dL
Ketones, ur: 15 mg/dL — AB
Leukocytes, UA: NEGATIVE
NITRITE: POSITIVE — AB
Protein, ur: 30 mg/dL — AB
SPECIFIC GRAVITY, URINE: 1.025 (ref 1.005–1.030)
Urobilinogen, UA: 0.2 mg/dL (ref 0.0–1.0)
pH: 7.5 (ref 5.0–8.0)

## 2013-09-02 LAB — URINE MICROSCOPIC-ADD ON

## 2013-09-02 MED ORDER — PROMETHAZINE HCL 25 MG/ML IJ SOLN: 25.0000 mg | Freq: Once | INTRAVENOUS | Status: DC

## 2013-09-02 MED ORDER — CEPHALEXIN 500 MG PO CAPS: 500.0000 mg | ORAL_CAPSULE | Freq: Four times a day (QID) | ORAL | Status: DC

## 2013-09-02 MED ORDER — DEXTROSE 5 % IV SOLN
1.0000 g | Freq: Once | INTRAVENOUS | Status: AC
  Administered 2013-09-02: 1 g via INTRAVENOUS
  Filled 2013-09-02: qty 10

## 2013-09-02 MED ORDER — SODIUM CHLORIDE 0.9 % IJ SOLN
1000.0000 mL | Freq: Once | INTRAMUSCULAR | Status: AC
  Administered 2013-09-02: 1000 mL via INTRAVENOUS

## 2013-09-02 MED ORDER — ONDANSETRON 8 MG/NS 50 ML IVPB
8.0000 mg | Freq: Once | INTRAVENOUS | Status: AC
  Administered 2013-09-02: 8 mg via INTRAVENOUS
  Filled 2013-09-02: qty 8

## 2013-09-02 MED ORDER — LACTATED RINGERS IV SOLN
Freq: Once | INTRAVENOUS | Status: AC
  Administered 2013-09-02: 12:00:00 via INTRAVENOUS

## 2013-09-02 NOTE — MAU Note (Signed)
Pt presents with complaints of nausea and vomiting for approximately 1 week with abdominal cramping. She is taking zofran and states that it isnt working anymore. She had a quant on January the 3rd and was suppose to follow up for a repeat quant the following Monday and when ask the pt states she did not return for the labs.

## 2013-09-02 NOTE — MAU Provider Note (Signed)
History     CSN: [id number]  Arrival date and time: 09/02/13 [id number]   First Provider Initiated Contact with Patient 09/02/13 1038      Chief Complaint  Patient presents with  . Morning Sickness   HPI [name] 33 y.o. 507w3d Comes to MAU today with vomiting.  Has vomited 5-6 times today and is now dizzy.  Took Zofran at home and it did not help today.  Has helped previously but not today.  Plans to see her PCP tomorrow - [facility] - and will get referral to provider for Prenatal care.  OB History   Grav Para Term Preterm Abortions TAB SAB Ect Mult Living   4 2 2  0 1 1 0 0 0 2      Past Medical History  Diagnosis Date  . Scoliosis   . Asthma   . Abnormal Pap smear 2011    repeat WNL; cryo 20011    Past Surgical History  Procedure Laterality Date  . Hand surgery Left     Family History  Problem Relation Age of Onset  . Anesthesia problems Neg Hx   . Diabetes Mother   . Sickle cell anemia Mother     History  Substance Use Topics  . Smoking status: Current Every Day Smoker -- 0.35 packs/day    Types: Cigarettes  . Smokeless tobacco: Never Used  . Alcohol Use: 1.2 oz/week    2 Cans of beer per week     Comment: occasionally    Allergies:  Allergies  Allergen Reactions  . Promethazine Hcl Palpitations    Makes her jittery    Prescriptions prior to admission  Medication Sig Dispense Refill  . ondansetron (ZOFRAN) 4 MG tablet Take 1 tablet (4 mg total) by mouth daily as needed for nausea or vomiting.  30 tablet  1    ROS Physical Exam   Temperature 98.4 F (36.9 C), temperature source Oral, resp. rate 18, height 4\' 11"  (1.499 m), weight 98 lb (44.453 kg), last menstrual period 07/12/2013.  Physical Exam  MAU Course  Procedures  MDM Results for orders placed during the hospital encounter of 09/02/13 (from the past 24 hour(s))  URINALYSIS, ROUTINE W REFLEX MICROSCOPIC     Status: Abnormal   Collection Time    09/02/13  9:50 AM      Result  Value Range   Color, Urine YELLOW  YELLOW   APPearance CLEAR  CLEAR   Specific Gravity, Urine 1.025  1.005 - 1.030   pH 7.5  5.0 - 8.0   Glucose, UA NEGATIVE  NEGATIVE mg/dL   Hgb urine dipstick TRACE (*) NEGATIVE   Bilirubin Urine NEGATIVE  NEGATIVE   Ketones, ur 15 (*) NEGATIVE mg/dL   Protein, ur 30 (*) NEGATIVE mg/dL   Urobilinogen, UA 0.2  0.0 - 1.0 mg/dL   Nitrite POSITIVE (*) NEGATIVE   Leukocytes, UA NEGATIVE  NEGATIVE  URINE MICROSCOPIC-ADD ON     Status: Abnormal   Collection Time    09/02/13  9:50 AM      Result Value Range   Squamous Epithelial / LPF MANY (*) RARE   WBC, UA 0-2  <3 WBC/hpf   RBC / HPF 3-6  <3 RBC/hpf   Bacteria, UA RARE  RARE   Urine-Other MUCOUS PRESENT     Able to take crackers by mouth after IVF infused.  Assessment and Plan  Hyperemesis UTI  Plan IVF with Zofran IVPB in MAU. Rocephin 1 gm IV given in  MAU as she is having vomiting and may not be able to take oral medication for UTI. Rx keflex 500 mg PO QID x 7 days Continue Zofran at home. BRAT diet and advance slowly.  No fried foods. See your doctor tomorrow and get your referral for prenatal care.   BURLESON,TERRI 09/02/2013, 10:53 AM

## 2013-09-02 NOTE — MAU Provider Note (Signed)
Attestation of Attending Supervision of Advanced Practitioner (PA/CNM/NP): Evaluation and management procedures were performed by the Advanced Practitioner under my supervision and collaboration.  I have reviewed the Advanced Practitioner's note and chart, and I agree with the management and plan.  Dessie Delcarlo S, MD [facility] Attending 09/02/2013 3:16 PM   

## 2013-09-02 NOTE — Discharge Instructions (Signed)
Continue Zofran at home. BRAT diet and advance slowly.  No fried foods. See your doctor tomorrow and get your referral for prenatal care. Get your prescription filled and take all as directed to clear the UTI. Return if the vomiting worsens or you are not able to keep down your medicines.  Also return if you develop back pain, body aches and fever.

## 2013-09-17 ENCOUNTER — Inpatient Hospital Stay (HOSPITAL_COMMUNITY): Payer: Medicaid Other

## 2013-09-17 ENCOUNTER — Encounter (HOSPITAL_COMMUNITY): Payer: Self-pay | Admitting: *Deleted

## 2013-09-17 ENCOUNTER — Inpatient Hospital Stay (HOSPITAL_COMMUNITY)
Admission: AD | Admit: 2013-09-17 | Discharge: 2013-09-17 | Disposition: A | Discharge status: Home / Self Care | Payer: Medicaid Other | Source: Ambulatory Visit | Attending: Obstetrics & Gynecology | Admitting: Obstetrics & Gynecology

## 2013-09-17 DIAGNOSIS — O208 Other hemorrhage in early pregnancy: Secondary | ICD-10-CM | POA: Insufficient documentation

## 2013-09-17 DIAGNOSIS — O219 Vomiting of pregnancy, unspecified: Secondary | ICD-10-CM

## 2013-09-17 DIAGNOSIS — J45909 Unspecified asthma, uncomplicated: Secondary | ICD-10-CM | POA: Insufficient documentation

## 2013-09-17 DIAGNOSIS — O99330 Smoking (tobacco) complicating pregnancy, unspecified trimester: Secondary | ICD-10-CM | POA: Insufficient documentation

## 2013-09-17 DIAGNOSIS — O210 Mild hyperemesis gravidarum: Secondary | ICD-10-CM | POA: Insufficient documentation

## 2013-09-17 LAB — COMPREHENSIVE METABOLIC PANEL WITH GFR
ALT: 10 U/L (ref 0–35)
AST: 10 U/L (ref 0–37)
Albumin: 3.7 g/dL (ref 3.5–5.2)
Alkaline Phosphatase: 47 U/L (ref 39–117)
BUN: 11 mg/dL (ref 6–23)
CO2: 27 meq/L (ref 19–32)
Calcium: 9.9 mg/dL (ref 8.4–10.5)
Chloride: 98 meq/L (ref 96–112)
Creatinine, Ser: 0.49 mg/dL — ABNORMAL LOW (ref 0.50–1.10)
GFR calc Af Amer: >90 mL/min
GFR calc non Af Amer: >90 mL/min
Glucose, Bld: 84 mg/dL (ref 70–99)
Potassium: 3.8 meq/L (ref 3.7–5.3)
Sodium: 138 meq/L (ref 137–147)
Total Bilirubin: 0.4 mg/dL (ref 0.3–1.2)
Total Protein: 6.9 g/dL (ref 6.0–8.3)

## 2013-09-17 LAB — CBC
HEMATOCRIT: 32.9 % — AB (ref 36.0–46.0)
HEMOGLOBIN: 11.7 g/dL — AB (ref 12.0–15.0)
MCH: 27.1 pg (ref 26.0–34.0)
MCHC: 35.6 g/dL (ref 30.0–36.0)
MCV: 76.2 fL — ABNORMAL LOW (ref 78.0–100.0)
Platelets: 321 10*3/uL (ref 150–400)
RBC: 4.32 MIL/uL (ref 3.87–5.11)
RDW: 12.6 % (ref 11.5–15.5)
WBC: 9.6 10*3/uL (ref 4.0–10.5)

## 2013-09-17 LAB — URINALYSIS, ROUTINE W REFLEX MICROSCOPIC
GLUCOSE, UA: NEGATIVE mg/dL
KETONES UR: 40 mg/dL — AB
LEUKOCYTES UA: NEGATIVE
Nitrite: NEGATIVE
PROTEIN: 30 mg/dL — AB
Specific Gravity, Urine: >1.03 — ABNORMAL HIGH (ref 1.005–1.030)
Urobilinogen, UA: 0.2 mg/dL (ref 0.0–1.0)
pH: 6 (ref 5.0–8.0)

## 2013-09-17 LAB — URINE MICROSCOPIC-ADD ON

## 2013-09-17 MED ORDER — METOCLOPRAMIDE HCL 10 MG PO TABS: 10.0000 mg | ORAL_TABLET | Freq: Four times a day (QID) | ORAL | Status: DC

## 2013-09-17 MED ORDER — LACTATED RINGERS IV SOLN
INTRAVENOUS | Status: DC
  Administered 2013-09-17: 15:00:00 via INTRAVENOUS

## 2013-09-17 MED ORDER — ONDANSETRON HCL 4 MG PO TABS: 4.0000 mg | ORAL_TABLET | Freq: Every day | ORAL | Status: DC | PRN

## 2013-09-17 MED ORDER — ONDANSETRON HCL 4 MG/2ML IJ SOLN
4.0000 mg | Freq: Once | INTRAMUSCULAR | Status: AC
  Administered 2013-09-17: 4 mg via INTRAVENOUS
  Filled 2013-09-17: qty 2

## 2013-09-17 MED ORDER — METOCLOPRAMIDE HCL 5 MG/ML IJ SOLN
10.0000 mg | Freq: Once | INTRAMUSCULAR | Status: AC
  Administered 2013-09-17: 10 mg via INTRAVENOUS
  Filled 2013-09-17: qty 2

## 2013-09-17 MED ORDER — DEXTROSE 5 % IN LACTATED RINGERS IV BOLUS
1000.0000 mL | Freq: Once | INTRAVENOUS | Status: AC
  Administered 2013-09-17: 1000 mL via INTRAVENOUS

## 2013-09-17 NOTE — MAU Note (Signed)
Seen here a couple wks ago for vomiting.  Has been taking zofran,  Getting worse; not able to keep anything down.  No bleeding, little bit of cramping in low abd.

## 2013-09-17 NOTE — Discharge Instructions (Signed)
Morning Sickness °Morning sickness is when you feel sick to your stomach (nauseous) during pregnancy. You may feel sick to your stomach and throw up (vomit). You may feel sick in the morning, but you can feel this way any time of day. Some women feel very sick to their stomach and cannot stop throwing up (hyperemesis gravidarum). °HOME CARE °· Only take medicines as told by your doctor. °· Take multivitamins as told by your doctor. Taking multivitamins before getting pregnant can stop or lessen the harshness of morning sickness. °· Eat dry toast or unsalted crackers before getting out of bed. °· Eat 5 to 6 small meals a day. °· Eat dry and bland foods like rice and baked potatoes. °· Do not drink liquids with meals. Drink between meals. °· Do not eat greasy, fatty, or spicy foods. °· Have someone cook for you if the smell of food causes you to feel sick or throw up. °· If you feel sick to your stomach after taking prenatal vitamins, take them at night or with a snack. °· Eat protein when you need a snack (nuts, yogurt, cheese). °· Eat unsweetened gelatins for dessert. °· Wear a bracelet used for sea sickness (acupressure wristband). °· Go to a doctor that puts thin needles into certain body points (acupuncture) to improve how you feel. °· Do not smoke. °· Use a humidifier to keep the air in your house free of odors. °· Get lots of fresh air. °GET HELP IF: °· You need medicine to feel better. °· You feel dizzy or lightheaded. °· You are losing weight. °GET HELP RIGHT AWAY IF:  °· You feel very sick to your stomach and cannot stop throwing up. °· You pass out (faint). °Document Released: 08/26/2004 Document Revised: 03/21/2013 Document Reviewed: 01/03/2013 °ExitCare® Patient Information ©2014 [employer]. ° °Hyperemesis Gravidarum Diet °Hyperemesis gravidarum is a severe form of morning sickness. It is characterized by frequent and severe vomiting. It happens during the first trimester of pregnancy. It may be caused  by the rapid hormone changes that happen during pregnancy. It is associated with a 5% weight loss of pre-pregnancy weight. The hyperemesis diet may be used to lessen symptoms of nausea and vomiting. °EATING GUIDELINES °· Eat 5 to 6 small meals daily instead of 3 large meals. °· Avoid foods with strong smells. °· Avoid drinking 30 minutes before and after meals. °· Avoid fried or high-fat foods, such as butter and cream sauces. °· Starchy foods are usually well-tolerated, such as cereal, toast, bread, potatoes, pasta, rice, and pretzels. °· Eat crackers before you get out of bed in the morning. °· Avoid spicy foods. °· Ginger may help with nausea. Add ¼ tsp ginger to hot tea or choose ginger tea. °· Continue to take your prenatal vitamins as directed by your caregiver. °SAMPLE MEAL PLAN °Breakfast  °· ½ cup oatmeal °· 1 slice toast °· 1 tsp heart-healthy margarine °· 1 tsp jelly °· 1 scrambled egg °Midmorning Snack  °· 1 cup low-fat yogurt °Lunch  °· Plain ham sandwich °· Carrot or celery sticks °· 1 small apple °· 3 graham crackers °Midafternoon Snack  °· Cheese and crackers °Dinner °· 4 oz pork tenderloin °· 1 small baked potato °· 1 tsp margarine °· ½ cup broccoli °· ½ cup grapes °Evening Snack °· 1 cup pudding °Document Released: 05/16/2007 Document Revised: 10/11/2011 Document Reviewed: 12/19/2012 °ExitCare® Patient Information ©2014 [employer]. ° °

## 2013-09-17 NOTE — MAU Provider Note (Signed)
History     CSN: [id number]  Arrival date and time: 09/17/13 1218   First Provider Initiated Contact with Patient 09/17/13 1253      Chief Complaint  Patient presents with  . Emesis During Pregnancy   HPI [name] is a 33 y.o. [id number] at [redacted]w[redacted]d who presents to MAU today with complaint of worsening N/V. The patient has had N/V x weeks and was given Zofran which she states is no longer helping. She has not kept anything down in 2-3 days. She denies fever, diarrhea, UTI symptoms or sick contacts. She has had mild lower abdominal cramping. The patient was seen in early pregnancy and had an [place] that did not confirm IUP. That patient did not return for her 48 follow-up labs or follow-up US.   OB History   Grav Para Term Preterm Abortions TAB SAB Ect Mult Living   4 2 2  0 1 1 0 0 0 2      Past Medical History  Diagnosis Date  . Scoliosis   . Asthma   . Abnormal Pap smear 2011    repeat WNL; cryo 20011    Past Surgical History  Procedure Laterality Date  . Hand surgery Left     Family History  Problem Relation Age of Onset  . Anesthesia problems Neg Hx   . Diabetes Mother   . Sickle cell anemia Mother     History  Substance Use Topics  . Smoking status: Current Every Day Smoker -- 0.35 packs/day    Types: Cigarettes  . Smokeless tobacco: Never Used  . Alcohol Use: 1.2 oz/week    2 Cans of beer per week     Comment: occasionally    Allergies:  Allergies  Allergen Reactions  . Promethazine Hcl Palpitations    Makes her jittery    Prescriptions prior to admission  Medication Sig Dispense Refill  . ondansetron (ZOFRAN) 4 MG tablet Take 1 tablet (4 mg total) by mouth daily as needed for nausea or vomiting.  30 tablet  1    Review of Systems  Constitutional: Positive for malaise/fatigue. Negative for fever.  Gastrointestinal: Positive for nausea, vomiting and abdominal pain. Negative for diarrhea and constipation.  Genitourinary: Negative for dysuria,  urgency and frequency.       Neg - vaginal bleeding, discharge  Neurological: Positive for dizziness and weakness. Negative for loss of consciousness.   Physical Exam   Blood pressure 100/61, pulse 100, temperature 98.1 F (36.7 C), temperature source Oral, resp. rate 16, height 4\' 11"  (1.499 m), weight 94 lb (42.638 kg), last menstrual period 07/12/2013, SpO2 99.00%.  Physical Exam  Constitutional: She is oriented to person, place, and time. She appears well-developed and well-nourished. No distress.  HENT:  Head: Normocephalic and atraumatic.  Cardiovascular: Regular rhythm and normal heart sounds.  Tachycardia present.   Respiratory: Effort normal and breath sounds normal. No respiratory distress.  GI: Soft. Bowel sounds are normal. She exhibits no distension and no mass. There is tenderness (diffuse tenderness to palpation worse in the epigastric region and lower abdomen). There is no rebound and no guarding.  Neurological: She is alert and oriented to person, place, and time.  Skin: Skin is warm and dry. No erythema.  Psychiatric: She has a normal mood and affect.   Results for orders placed during the hospital encounter of 09/17/13 (from the past 24 hour(s))  URINALYSIS, ROUTINE W REFLEX MICROSCOPIC     Status: Abnormal   Collection Time  09/17/13 12:35 PM      Result Value Ref Range   Color, Urine AMBER (*) YELLOW   APPearance CLEAR  CLEAR   Specific Gravity, Urine >1.030 (*) 1.005 - 1.030   pH 6.0  5.0 - 8.0   Glucose, UA NEGATIVE  NEGATIVE mg/dL   Hgb urine dipstick TRACE (*) NEGATIVE   Bilirubin Urine SMALL (*) NEGATIVE   Ketones, ur 40 (*) NEGATIVE mg/dL   Protein, ur 30 (*) NEGATIVE mg/dL   Urobilinogen, UA 0.2  0.0 - 1.0 mg/dL   Nitrite NEGATIVE  NEGATIVE   Leukocytes, UA NEGATIVE  NEGATIVE  URINE MICROSCOPIC-ADD ON     Status: Abnormal   Collection Time    09/17/13 12:35 PM      Result Value Ref Range   Squamous Epithelial / LPF MANY (*) RARE   WBC, UA 0-2   <3 WBC/hpf   RBC / HPF 3-6  <3 RBC/hpf   Bacteria, UA MANY (*) RARE   Urine-Other MUCOUS PRESENT    CBC     Status: Abnormal   Collection Time    09/17/13  1:11 PM      Result Value Ref Range   WBC 9.6  4.0 - 10.5 K/uL   RBC 4.32  3.87 - 5.11 MIL/uL   Hemoglobin 11.7 (*) 12.0 - 15.0 g/dL   HCT 16.1 (*) 09.6 - 04.5 %   MCV 76.2 (*) 78.0 - 100.0 fL   MCH 27.1  26.0 - 34.0 pg   MCHC 35.6  30.0 - 36.0 g/dL   RDW 40.9  81.1 - 91.4 %   Platelets 321  150 - 400 K/uL  COMPREHENSIVE METABOLIC PANEL     Status: Abnormal   Collection Time    09/17/13  1:11 PM      Result Value Ref Range   Sodium 138  137 - 147 mEq/L   Potassium 3.8  3.7 - 5.3 mEq/L   Chloride 98  96 - 112 mEq/L   CO2 27  19 - 32 mEq/L   Glucose, Bld 84  70 - 99 mg/dL   BUN 11  6 - 23 mg/dL   Creatinine, Ser 7.82 (*) 0.50 - 1.10 mg/dL   Calcium 9.9  8.4 - 95.6 mg/dL   Total Protein 6.9  6.0 - 8.3 g/dL   Albumin 3.7  3.5 - 5.2 g/dL   AST 10  0 - 37 U/L   ALT 10  0 - 35 U/L   Alkaline Phosphatase 47  39 - 117 U/L   Total Bilirubin 0.4  0.3 - 1.2 mg/dL   GFR calc non Af Amer >90  >90 mL/min   GFR calc Af Amer >90  >90 mL/min   US Ob Comp Less 14 Wks  09/17/2013   CLINICAL DATA:  Lower abdominal and pelvic pain.  EXAM: OBSTETRIC <14 WK ULTRASOUND  TECHNIQUE: Transabdominal ultrasound was performed for evaluation of the gestation as well as the maternal uterus and adnexal regions.  COMPARISON:  08/04/2013  FINDINGS: Intrauterine gestational sac: Visualized/normal in shape.  Yolk sac:  Visualized  Embryo:  Visualized  Cardiac Activity: Visualized  Heart Rate: 156 bpm  CRL:   32  mm   10 w 1 d                  [place] EDC: 04/14/2014  Maternal uterus/adnexae: Small subchorionic hemorrhage noted. Both ovaries are normal in appearance. No adnexal mass or free fluid identified.  IMPRESSION: Single living IUP  measuring 10 weeks 1 day with US EDC of 04/14/2014.  Small subchorionic hemorrhage noted.   Electronically Signed   By: Myles RosenthalJohn  Stahl  M.D.   On: 09/17/2013 13:57    MAU Course  Procedures None  MDM UA and US today 1 liter IV D5LR with 4 mg Zofran IV given Patient reports significant improvement in symptoms and is able to tolerate PO in MAU today Patient then reports that she is feeling nauseous again. No additional episodes of emesis while in MAU. Patient reports improvement in symptoms Assessment and Plan  A: SIUP at 4850w1d with normal cardiac activity Small subchorionic hemorrhage Nausea and vomiting in pregnancy prior to [redacted] weeks gestation  P: Discharge home Rx for Zofran and Reglan sent to patient's pharmacy Bleeding precautions discussed AVS contains information about hyperemesis diet and morning sickness Patient encourageed to keep appointment to start prenatal care with [facility] Patient may return to MAU as needed or if her condition were to change or worsen  Freddi StarrJulie N Ethier, PA-C  09/17/2013, 2:47 PM

## 2013-09-19 ENCOUNTER — Encounter (HOSPITAL_COMMUNITY): Payer: Self-pay | Admitting: *Deleted

## 2013-09-19 ENCOUNTER — Inpatient Hospital Stay (HOSPITAL_COMMUNITY)
Admission: AD | Admit: 2013-09-19 | Discharge: 2013-09-19 | Disposition: A | Discharge status: Home / Self Care | Payer: Medicaid Other | Source: Ambulatory Visit | Attending: Obstetrics & Gynecology | Admitting: Obstetrics & Gynecology

## 2013-09-19 DIAGNOSIS — O219 Vomiting of pregnancy, unspecified: Secondary | ICD-10-CM

## 2013-09-19 DIAGNOSIS — O210 Mild hyperemesis gravidarum: Secondary | ICD-10-CM

## 2013-09-19 DIAGNOSIS — J45909 Unspecified asthma, uncomplicated: Secondary | ICD-10-CM | POA: Insufficient documentation

## 2013-09-19 DIAGNOSIS — O99330 Smoking (tobacco) complicating pregnancy, unspecified trimester: Secondary | ICD-10-CM | POA: Insufficient documentation

## 2013-09-19 DIAGNOSIS — R109 Unspecified abdominal pain: Secondary | ICD-10-CM | POA: Insufficient documentation

## 2013-09-19 LAB — URINALYSIS, ROUTINE W REFLEX MICROSCOPIC
BILIRUBIN URINE: NEGATIVE
Glucose, UA: NEGATIVE mg/dL
Ketones, ur: 15 mg/dL — AB
Leukocytes, UA: NEGATIVE
NITRITE: NEGATIVE
PH: 8.5 — AB (ref 5.0–8.0)
Protein, ur: 30 mg/dL — AB
SPECIFIC GRAVITY, URINE: 1.02 (ref 1.005–1.030)
Urobilinogen, UA: 0.2 mg/dL (ref 0.0–1.0)

## 2013-09-19 LAB — URINE MICROSCOPIC-ADD ON

## 2013-09-19 MED ORDER — DEXAMETHASONE SODIUM PHOSPHATE 10 MG/ML IJ SOLN
10.0000 mg | Freq: Once | INTRAMUSCULAR | Status: AC
  Administered 2013-09-19: 10 mg via INTRAVENOUS
  Filled 2013-09-19: qty 1

## 2013-09-19 MED ORDER — DIPHENHYDRAMINE HCL 50 MG/ML IJ SOLN
25.0000 mg | Freq: Once | INTRAMUSCULAR | Status: AC
  Administered 2013-09-19: 25 mg via INTRAVENOUS
  Filled 2013-09-19: qty 1

## 2013-09-19 MED ORDER — SODIUM CHLORIDE 0.9 % IV SOLN
INTRAVENOUS | Status: DC
  Administered 2013-09-19: 09:00:00 via INTRAVENOUS

## 2013-09-19 MED ORDER — METOCLOPRAMIDE HCL 5 MG/ML IJ SOLN
10.0000 mg | Freq: Once | INTRAMUSCULAR | Status: AC
  Administered 2013-09-19: 10 mg via INTRAVENOUS
  Filled 2013-09-19: qty 2

## 2013-09-19 NOTE — MAU Provider Note (Signed)
History     CSN: [id number]  Arrival date and time: 09/19/13 4098   First Provider Initiated Contact with Patient 09/19/13 0759      Chief Complaint  Patient presents with  . Morning Sickness   HPI [name] is a 33 y.o. [id number] at [redacted]w[redacted]d who presents to MAU today by EMS with complaint of continued N/V. The patient was here on Monday and given IV fluids and Zofran and Reglan and was able to take in PO while in MAU. She was sent home with Rx for Zofran and Reglan and did not fill either because of the weather. She was given fluids and Zofran be EMS this morning and states that she felt somewhat better but she still asked to be brought in to MAU for further evaluation. She states mild occasional diffuse abdominal pain, worse after emesis. She denies fever, diarrhea, constipation or UTI symptoms. She states mild to moderate occasional headache. Pain now is rated at 5/10.    OB History   Grav Para Term Preterm Abortions TAB SAB Ect Mult Living   4 2 2  0 1 1 0 0 0 2      Past Medical History  Diagnosis Date  . Scoliosis   . Asthma   . Abnormal Pap smear 2011    repeat WNL; cryo 20011    Past Surgical History  Procedure Laterality Date  . Hand surgery Left     Family History  Problem Relation Age of Onset  . Anesthesia problems Neg Hx   . Diabetes Mother   . Sickle cell anemia Mother     History  Substance Use Topics  . Smoking status: Current Every Day Smoker -- 0.35 packs/day    Types: Cigarettes  . Smokeless tobacco: Never Used  . Alcohol Use: 1.2 oz/week    2 Cans of beer per week     Comment: occasionally    Allergies:  Allergies  Allergen Reactions  . Promethazine Hcl Palpitations    Makes her jittery    Prescriptions prior to admission  Medication Sig Dispense Refill  . ondansetron (ZOFRAN) 4 MG tablet Take 1 tablet (4 mg total) by mouth daily as needed for nausea or vomiting.  30 tablet  0    Review of Systems  Constitutional: Negative for  fever and malaise/fatigue.  Gastrointestinal: Positive for nausea, vomiting and abdominal pain. Negative for diarrhea and constipation.  Genitourinary: Negative for dysuria, urgency and frequency.       Neg - vaginal bleeding, discharge  Neurological: Positive for dizziness and weakness. Negative for loss of consciousness.   Physical Exam   Blood pressure 118/94, temperature 98.2 F (36.8 C), temperature source Oral, resp. rate 18, last menstrual period 07/12/2013.  Physical Exam  Constitutional: She is oriented to person, place, and time. She appears well-developed and well-nourished. No distress.  HENT:  Head: Normocephalic and atraumatic.  Cardiovascular: Normal rate.   Respiratory: Effort normal.  GI: Soft. She exhibits no distension and no mass. There is tenderness (mild diffuse tenderness to palpation of the abdomen). There is no rebound and no guarding.  Neurological: She is alert and oriented to person, place, and time.  Skin: Skin is warm and dry. No erythema.  Psychiatric: She has a normal mood and affect.   Results for orders placed during the hospital encounter of 09/19/13 (from the past 24 hour(s))  URINALYSIS, ROUTINE W REFLEX MICROSCOPIC     Status: Abnormal   Collection Time    09/19/13  7:44 AM      Result Value Ref Range   Color, Urine YELLOW  YELLOW   APPearance CLOUDY (*) CLEAR   Specific Gravity, Urine 1.020  1.005 - 1.030   pH 8.5 (*) 5.0 - 8.0   Glucose, UA NEGATIVE  NEGATIVE mg/dL   Hgb urine dipstick TRACE (*) NEGATIVE   Bilirubin Urine NEGATIVE  NEGATIVE   Ketones, ur 15 (*) NEGATIVE mg/dL   Protein, ur 30 (*) NEGATIVE mg/dL   Urobilinogen, UA 0.2  0.0 - 1.0 mg/dL   Nitrite NEGATIVE  NEGATIVE   Leukocytes, UA NEGATIVE  NEGATIVE  URINE MICROSCOPIC-ADD ON     Status: Abnormal   Collection Time    09/19/13  7:44 AM      Result Value Ref Range   Squamous Epithelial / LPF RARE  RARE   WBC, UA 0-2  <3 WBC/hpf   Bacteria, UA FEW (*) RARE    MAU  Course  Procedures None  MDM UA today shows mild dehydration 1 liter IV NS, 25 mg Benadryl, 10 mg Reglan and 10 mg Decadron given in MAU for headache, N/V and dehydration Patient able to tolerate PO in MAU today without emesis Assessment and Plan  A: Nausea and vomiting in pregnancy prior to [redacted] weeks gestation  P: Discharge home Patient advised to fill previously prescribed Rx for Zofran and Reglan Hyperemesis diet discussed. Advance as tolerated Recommended Tylenol PRN for headache Encouraged increased PO hydration as tolerated Patient advised to keep scheduled appointment to [facility] Patient may return to MAU as needed or if her condition were to change or worsen  Freddi StarrJulie N Ethier, PA-C  09/19/2013, 7:59 AM

## 2013-09-19 NOTE — Discharge Instructions (Signed)
Hyperemesis Gravidarum Diet Hyperemesis gravidarum is a severe form of morning sickness. It is characterized by frequent and severe vomiting. It happens during the first trimester of pregnancy. It may be caused by the rapid hormone changes that happen during pregnancy. It is associated with a 5% weight loss of pre-pregnancy weight. The hyperemesis diet may be used to lessen symptoms of nausea and vomiting. EATING GUIDELINES  Eat 5 to 6 small meals daily instead of 3 large meals.  Avoid foods with strong smells.  Avoid drinking 30 minutes before and after meals.  Avoid fried or high-fat foods, such as butter and cream sauces.  Starchy foods are usually well-tolerated, such as cereal, toast, bread, potatoes, pasta, rice, and pretzels.  Eat crackers before you get out of bed in the morning.  Avoid spicy foods.  Ginger may help with nausea. Add  tsp ginger to hot tea or choose ginger tea.  Continue to take your prenatal vitamins as directed by your caregiver. SAMPLE MEAL PLAN Breakfast    cup oatmeal  1 slice toast  1 tsp heart-healthy margarine  1 tsp jelly  1 scrambled egg Midmorning Snack   1 cup low-fat yogurt Lunch   Plain ham sandwich  Carrot or celery sticks  1 small apple  3 graham crackers Midafternoon Snack   Cheese and crackers Dinner  4 oz pork tenderloin  1 small baked potato  1 tsp margarine   cup broccoli   cup grapes Evening Snack  1 cup pudding Document Released: 05/16/2007 Document Revised: 10/11/2011 Document Reviewed: 12/19/2012 Exit[employer] Patient Information 2014 [place] [place].  Morning Sickness Morning sickness is when you feel sick to your stomach (nauseous) during pregnancy. This nauseous feeling may or may not come with vomiting. It often occurs in the morning but can be a problem any time of day. Morning sickness is most common during the first trimester, but it may continue throughout pregnancy. While morning sickness is  unpleasant, it is usually harmless unless you develop severe and continual vomiting (hyperemesis gravidarum). This condition requires more intense treatment.  CAUSES  The cause of morning sickness is not completely known but seems to be related to normal hormonal changes that occur in pregnancy. RISK FACTORS You are at greater risk if you:  Experienced nausea or vomiting before your pregnancy.  Had morning sickness during a previous pregnancy.  Are pregnant with more than one baby, such as twins. TREATMENT  Do not use any medicines (prescription, over-the-counter, or herbal) for morning sickness without first talking to your health care provider. Your health care provider may prescribe or recommend:  Vitamin B6 supplements.  Anti-nausea medicines.  The herbal medicine ginger. HOME CARE INSTRUCTIONS   Only take over-the-counter or prescription medicines as directed by your health care provider.  Taking multivitamins before getting pregnant can prevent or decrease the severity of morning sickness in most women.   Eat a piece of dry toast or unsalted crackers before getting out of bed in the morning.   Eat five or six small meals a day.   Eat dry and bland foods (rice, baked potato). Foods high in carbohydrates are often helpful.  Do not drink liquids with your meals. Drink liquids between meals.   Avoid greasy, fatty, and spicy foods.   Get someone to cook for you if the smell of any food causes nausea and vomiting.   If you feel nauseous after taking prenatal vitamins, take the vitamins at night or with a snack.  Snack on protein foods (  nuts, yogurt, cheese) between meals if you are hungry.   Eat unsweetened gelatins for desserts.   Wearing an acupressure wristband (worn for sea sickness) may be helpful.   Acupuncture may be helpful.   Do not smoke.   Get a humidifier to keep the air in your house free of odors.   Get plenty of fresh air. SEEK MEDICAL  CARE IF:   Your home remedies are not working, and you need medicine.  You feel dizzy or lightheaded.  You are losing weight. SEEK IMMEDIATE MEDICAL CARE IF:   You have persistent and uncontrolled nausea and vomiting.  You pass out (faint). Document Released: 09/09/2006 Document Revised: 03/21/2013 Document Reviewed: 01/03/2013 [facility] Patient Information 2014 [place], [place].

## 2013-09-19 NOTE — MAU Note (Signed)
Pt presents with complaints of nausea and vomiting since Monday. She states she was evaluated in MAU on Monday and given a prescription for zofran but has not gotten it filled yet.

## 2013-09-19 NOTE — MAU Note (Signed)
Pt came in EMS for N&V.  Pt seen in MAU 2 days ago, received prescription for Zofran, has not had prescription filled.  IV started by EMS.

## 2013-09-22 ENCOUNTER — Inpatient Hospital Stay (HOSPITAL_COMMUNITY)
Admission: AD | Admit: 2013-09-22 | Discharge: 2013-09-22 | Disposition: A | Discharge status: Home / Self Care | Payer: Medicaid Other | Source: Ambulatory Visit | Attending: Obstetrics & Gynecology | Admitting: Obstetrics & Gynecology

## 2013-09-22 ENCOUNTER — Encounter (HOSPITAL_COMMUNITY): Payer: Self-pay | Admitting: Family

## 2013-09-22 DIAGNOSIS — Z888 Allergy status to other drugs, medicaments and biological substances status: Secondary | ICD-10-CM | POA: Insufficient documentation

## 2013-09-22 DIAGNOSIS — O210 Mild hyperemesis gravidarum: Secondary | ICD-10-CM | POA: Insufficient documentation

## 2013-09-22 DIAGNOSIS — O99330 Smoking (tobacco) complicating pregnancy, unspecified trimester: Secondary | ICD-10-CM | POA: Insufficient documentation

## 2013-09-22 LAB — URINALYSIS, ROUTINE W REFLEX MICROSCOPIC
BILIRUBIN URINE: NEGATIVE
Glucose, UA: NEGATIVE mg/dL
Hgb urine dipstick: NEGATIVE
KETONES UR: 15 mg/dL — AB
Leukocytes, UA: NEGATIVE
Nitrite: NEGATIVE
Protein, ur: NEGATIVE mg/dL
SPECIFIC GRAVITY, URINE: 1.02 (ref 1.005–1.030)
UROBILINOGEN UA: 0.2 mg/dL (ref 0.0–1.0)
pH: 8.5 — ABNORMAL HIGH (ref 5.0–8.0)

## 2013-09-22 MED ORDER — METOCLOPRAMIDE HCL 5 MG/ML IJ SOLN
10.0000 mg | Freq: Once | INTRAMUSCULAR | Status: AC
  Administered 2013-09-22: 10 mg via INTRAVENOUS
  Filled 2013-09-22: qty 2

## 2013-09-22 MED ORDER — ONDANSETRON HCL 4 MG/2ML IJ SOLN
4.0000 mg | Freq: Once | INTRAMUSCULAR | Status: AC
  Administered 2013-09-22: 4 mg via INTRAVENOUS
  Filled 2013-09-22: qty 2

## 2013-09-22 MED ORDER — PROCHLORPERAZINE EDISYLATE 5 MG/ML IJ SOLN
10.0000 mg | Freq: Four times a day (QID) | INTRAMUSCULAR | Status: DC | PRN
  Administered 2013-09-22: 10 mg via INTRAVENOUS
  Filled 2013-09-22: qty 2

## 2013-09-22 MED ORDER — M.V.I. ADULT IV INJ
INJECTION | Freq: Once | INTRAVENOUS | Status: AC
  Administered 2013-09-22: 15:00:00 via INTRAVENOUS
  Filled 2013-09-22: qty 1000

## 2013-09-22 MED ORDER — ONDANSETRON 4 MG PO TBDP: 4.0000 mg | ORAL_TABLET | Freq: Three times a day (TID) | ORAL | Status: DC | PRN

## 2013-09-22 MED ORDER — PROCHLORPERAZINE 25 MG RE SUPP: 25.0000 mg | Freq: Two times a day (BID) | RECTAL | Status: DC | PRN

## 2013-09-22 NOTE — MAU Provider Note (Signed)
Attestation of Attending Supervision of Advanced Practitioner (CNM/NP): Evaluation and management procedures were performed by the Advanced Practitioner under my supervision and collaboration.  I have reviewed the Advanced Practitioner's note and chart, and I agree with the management and plan.  HARRAWAY-SMITH, Charlee Squibb 7:56 PM     

## 2013-09-22 NOTE — MAU Provider Note (Signed)
History     CSN: [id number]  Arrival date and time: 09/22/13 1331   First Provider Initiated Contact with Patient 09/22/13 1432      Chief Complaint  Patient presents with  . Emesis During Pregnancy   HPI  [name] is a 33 y.o. female [phone] at 2723w6d who presents to MAU via EMS with vomiting in pregnancy. Pt has been to MAU several times in this pregnancy; the last time she was here was 2 days ago. She was sent home with reglan and zofran and is unable to keep it down. She has been somewhat noncompliant with her nausea medication and did not start taking it until 2 days go.  She was never given ODT tabs, just the regular pills and she is unable to swallow them down.  Pt is allergic to phenergan.   OB History   Grav Para Term Preterm Abortions TAB SAB Ect Mult Living   4 2 2  0 1 1 0 0 0 2      Past Medical History  Diagnosis Date  . Scoliosis   . Asthma   . Abnormal Pap smear 2011    repeat WNL; cryo 20011    Past Surgical History  Procedure Laterality Date  . Hand surgery Left     Family History  Problem Relation Age of Onset  . Anesthesia problems Neg Hx   . Diabetes Mother   . Sickle cell anemia Mother     History  Substance Use Topics  . Smoking status: Current Every Day Smoker -- 0.35 packs/day    Types: Cigarettes  . Smokeless tobacco: Never Used  . Alcohol Use: 1.2 oz/week    2 Cans of beer per week     Comment: occasionally    Allergies:  Allergies  Allergen Reactions  . Promethazine Hcl Palpitations    Makes her jittery    Prescriptions prior to admission  Medication Sig Dispense Refill  . ondansetron (ZOFRAN) 4 MG tablet Take 1 tablet (4 mg total) by mouth daily as needed for nausea or vomiting.  30 tablet  0   Results for orders placed during the hospital encounter of 09/22/13 (from the past 48 hour(s))  URINALYSIS, ROUTINE W REFLEX MICROSCOPIC     Status: Abnormal   Collection Time    09/22/13  2:00 PM      Result Value Ref  Range   Color, Urine YELLOW  YELLOW   APPearance CLOUDY (*) CLEAR   Specific Gravity, Urine 1.020  1.005 - 1.030   pH 8.5 (*) 5.0 - 8.0   Glucose, UA NEGATIVE  NEGATIVE mg/dL   Hgb urine dipstick NEGATIVE  NEGATIVE   Bilirubin Urine NEGATIVE  NEGATIVE   Ketones, ur 15 (*) NEGATIVE mg/dL   Protein, ur NEGATIVE  NEGATIVE mg/dL   Urobilinogen, UA 0.2  0.0 - 1.0 mg/dL   Nitrite NEGATIVE  NEGATIVE   Leukocytes, UA NEGATIVE  NEGATIVE   Comment: MICROSCOPIC NOT DONE ON URINES WITH NEGATIVE PROTEIN, BLOOD, LEUKOCYTES, NITRITE, OR GLUCOSE <1000 mg/dL.    Review of Systems  Gastrointestinal: Positive for nausea, vomiting and abdominal pain (+ cramping in the lower abdomen ). Negative for diarrhea and constipation.  Genitourinary: Negative for dysuria, urgency, frequency, hematuria and flank pain.       No vaginal discharge. No vaginal bleeding. No dysuria.   Neurological: Positive for headaches (occasional ). Negative for dizziness and weakness.   Physical Exam   Blood pressure 93/49, pulse 64, temperature 98 F (  36.7 C), temperature source Oral, resp. rate 16, weight 42.547 kg (93 lb 12.8 oz), last menstrual period 07/12/2013, SpO2 100.00%. Fetal heart tones by doppler 155 bpm   Physical Exam  Constitutional: She appears well-developed and well-nourished.  Non-toxic appearance. She does not have a sickly appearance. She does not appear ill. No distress.  HENT:  Head: Normocephalic.  Eyes: Pupils are equal, round, and reactive to light.  Neck: Neck supple.  GI: Soft. She exhibits no distension and no mass. There is generalized tenderness. There is no rebound and no guarding.  Skin: She is not diaphoretic.    MAU Course  Procedures None  MDM + fht  Urine shows mild dehydration  Pt given reglan and zofran IV; pt vomited in MAU times 1 Discussed plan of care with Dr. Erin Fulling  Compazine 10 mg IV ordered; will monitor closely due to phenergan allergy Pt tolerate compazine  well. Will plan to send home with suppositories.  Pt has an appointment with [facility] Discussed diet options with the patient in detail.   Assessment and Plan   A:  1. Hyperemesis complicating pregnancy, antepartum     P:  Discharge home in stable condition RX: compazine suppositories        zofran ODT Keep your prenatal appointment Return to MAU if symptoms worsen    Holly Hansen Darcee Dekker, NP  09/22/2013, 7:41 PM

## 2013-09-22 NOTE — MAU Note (Signed)
33 yo, G4P2 at 3737w6d, presents to MAU via EMS with c/o nausea and vomiting since yesterday. Reports 15 emesis occurrences since that time, reports dark red clumps in emesis this morning.  Reports feeling dizzy during those occurrences. Patient has Reglan and Zofran for Rx; threw up both doses since 1230.  Denies VB, contractions, LOF.

## 2013-09-22 NOTE — Discharge Instructions (Signed)
Hyperemesis Gravidarum Diet °Hyperemesis gravidarum is a severe form of morning sickness. It is characterized by frequent and severe vomiting. It happens during the first trimester of pregnancy. It may be caused by the rapid hormone changes that happen during pregnancy. It is associated with a 5% weight loss of pre-pregnancy weight. The hyperemesis diet may be used to lessen symptoms of nausea and vomiting. °EATING GUIDELINES °· Eat 5 to 6 small meals daily instead of 3 large meals. °· Avoid foods with strong smells. °· Avoid drinking 30 minutes before and after meals. °· Avoid fried or high-fat foods, such as butter and cream sauces. °· Starchy foods are usually well-tolerated, such as cereal, toast, bread, potatoes, pasta, rice, and pretzels. °· Eat crackers before you get out of bed in the morning. °· Avoid spicy foods. °· Ginger may help with nausea. Add ¼ tsp ginger to hot tea or choose ginger tea. °· Continue to take your prenatal vitamins as directed by your caregiver. °SAMPLE MEAL PLAN °Breakfast  °· ½ cup oatmeal °· 1 slice toast °· 1 tsp heart-healthy margarine °· 1 tsp jelly °· 1 scrambled egg °Midmorning Snack  °· 1 cup low-fat yogurt °Lunch  °· Plain ham sandwich °· Carrot or celery sticks °· 1 small apple °· 3 graham crackers °Midafternoon Snack  °· Cheese and crackers °Dinner °· 4 oz pork tenderloin °· 1 small baked potato °· 1 tsp margarine °· ½ cup broccoli °· ½ cup grapes °Evening Snack °· 1 cup pudding °Document Released: 05/16/2007 Document Revised: 10/11/2011 Document Reviewed: 12/19/2012 °ExitCare® Patient Information ©2014 [employer]. ° °Hyperemesis Gravidarum °Hyperemesis gravidarum is a severe form of nausea and vomiting that happens during pregnancy. Hyperemesis is worse than morning sickness. It may cause you to have nausea or vomiting all day for many days. It may keep you from eating and drinking enough food and liquids. Hyperemesis usually occurs during the first half (the first 20  weeks) of pregnancy. It often goes away once a woman is in her second half of pregnancy. However, sometimes hyperemesis continues through an entire pregnancy.  °CAUSES  °The cause of this condition is not completely known but is thought to be related to changes in the body's hormones when pregnant. It could be from the high level of the pregnancy hormone or an increase in estrogen in the body.  °SIGNS AND SYMPTOMS  °· Severe nausea and vomiting. °· Nausea that does not go away. °· Vomiting that does not allow you to keep any food down. °· Weight loss and body fluid loss (dehydration). °· Having no desire to eat or not liking food you have previously enjoyed. °DIAGNOSIS  °Your health care provider will do a physical exam and ask you about your symptoms. He or she may also order blood tests and urine tests to make sure something else is not causing the problem.  °TREATMENT  °You may only need medicine to control the problem. If medicines do not control the nausea and vomiting, you will be treated in the hospital to prevent dehydration, increased acid in the blood (acidosis), weight loss, and changes in the electrolytes in your body that may harm the unborn baby (fetus). You may need IV fluids.  °HOME CARE INSTRUCTIONS  °· Only take over-the-counter or prescription medicines as directed by your health care provider. °· Try eating a couple of dry crackers or toast in the morning before getting out of bed. °· Avoid foods and smells that upset your stomach. °· Avoid fatty and   spicy foods. °· Eat 5 6 small meals a day. °· Do not drink when eating meals. Drink between meals. °· For snacks, eat high-protein foods, such as cheese. °· Eat or suck on things that have ginger in them. Ginger helps nausea. °· Avoid food preparation. The smell of food can spoil your appetite. °· Avoid iron pills and iron in your multivitamins until after 3 4 months of being pregnant. However, consult with your health care provider before stopping  any prescribed iron pills. °SEEK MEDICAL CARE IF:  °· Your abdominal pain increases. °· You have a severe headache. °· You have vision problems. °· You are losing weight. °SEEK IMMEDIATE MEDICAL CARE IF:  °· You are unable to keep fluids down. °· You vomit blood. °· You have constant nausea and vomiting. °· You have excessive weakness. °· You have extreme thirst. °· You have dizziness or fainting. °· You have a fever or persistent symptoms for more than 2 3 days. °· You have a fever and your symptoms suddenly get worse. °MAKE SURE YOU:  °· Understand these instructions. °· Will watch your condition. °· Will get help right away if you are not doing well or get worse. °Document Released: 07/19/2005 Document Revised: 05/09/2013 Document Reviewed: 02/28/2013 °ExitCare® Patient Information ©2014 [employer]. ° °

## 2013-09-24 LAB — OB RESULTS CONSOLE RPR: RPR: NONREACTIVE

## 2013-09-24 LAB — OB RESULTS CONSOLE HIV ANTIBODY (ROUTINE TESTING): HIV: NONREACTIVE

## 2013-09-24 LAB — OB RESULTS CONSOLE RUBELLA ANTIBODY, IGM: Rubella: IMMUNE

## 2013-09-24 LAB — OB RESULTS CONSOLE HEPATITIS B SURFACE ANTIGEN: HEP B S AG: NEGATIVE

## 2013-09-24 LAB — OB RESULTS CONSOLE VARICELLA ZOSTER ANTIBODY, IGG: Varicella: IMMUNE

## 2013-09-24 LAB — OB RESULTS CONSOLE ABO/RH: RH Type: POSITIVE

## 2013-09-24 LAB — OB RESULTS CONSOLE GC/CHLAMYDIA
Chlamydia: NEGATIVE
GC PROBE AMP, GENITAL: NEGATIVE

## 2013-09-24 LAB — SICKLE CELL SCREEN: Sickle Cell Screen: ELEVATED

## 2013-09-24 LAB — OB RESULTS CONSOLE ANTIBODY SCREEN: Antibody Screen: NEGATIVE

## 2013-09-27 ENCOUNTER — Inpatient Hospital Stay (HOSPITAL_COMMUNITY)
Admission: AD | Admit: 2013-09-27 | Discharge: 2013-09-27 | Disposition: A | Discharge status: Home / Self Care | Payer: Medicaid Other | Source: Ambulatory Visit | Attending: Obstetrics and Gynecology | Admitting: Obstetrics and Gynecology

## 2013-09-27 ENCOUNTER — Encounter (HOSPITAL_COMMUNITY): Payer: Self-pay

## 2013-09-27 DIAGNOSIS — J45909 Unspecified asthma, uncomplicated: Secondary | ICD-10-CM | POA: Insufficient documentation

## 2013-09-27 DIAGNOSIS — O210 Mild hyperemesis gravidarum: Secondary | ICD-10-CM | POA: Insufficient documentation

## 2013-09-27 DIAGNOSIS — K5900 Constipation, unspecified: Secondary | ICD-10-CM | POA: Insufficient documentation

## 2013-09-27 DIAGNOSIS — O99330 Smoking (tobacco) complicating pregnancy, unspecified trimester: Secondary | ICD-10-CM | POA: Insufficient documentation

## 2013-09-27 LAB — URINE MICROSCOPIC-ADD ON

## 2013-09-27 LAB — URINALYSIS, ROUTINE W REFLEX MICROSCOPIC
BILIRUBIN URINE: NEGATIVE
Glucose, UA: NEGATIVE mg/dL
HGB URINE DIPSTICK: NEGATIVE
Ketones, ur: 15 mg/dL — AB
Leukocytes, UA: NEGATIVE
Nitrite: NEGATIVE
PROTEIN: 100 mg/dL — AB
Specific Gravity, Urine: 1.015 (ref 1.005–1.030)
Urobilinogen, UA: 0.2 mg/dL (ref 0.0–1.0)
pH: >9 — ABNORMAL HIGH (ref 5.0–8.0)

## 2013-09-27 MED ORDER — LACTATED RINGERS IV SOLN
INTRAVENOUS | Status: DC
  Administered 2013-09-27: 12:00:00 via INTRAVENOUS

## 2013-09-27 MED ORDER — M.V.I. ADULT IV INJ
Freq: Once | INTRAVENOUS | Status: AC
  Administered 2013-09-27: 06:00:00 via INTRAVENOUS
  Filled 2013-09-27: qty 1000

## 2013-09-27 MED ORDER — METHYLPREDNISOLONE SODIUM SUCC 125 MG IJ SOLR
16.0000 mg | Freq: Once | INTRAMUSCULAR | Status: AC
  Administered 2013-09-27: 16.25 mg via INTRAVENOUS
  Filled 2013-09-27: qty 2

## 2013-09-27 MED ORDER — ONDANSETRON HCL 4 MG/2ML IJ SOLN
4.0000 mg | Freq: Once | INTRAMUSCULAR | Status: AC
  Administered 2013-09-27: 4 mg via INTRAVENOUS
  Filled 2013-09-27: qty 2

## 2013-09-27 MED ORDER — METOCLOPRAMIDE HCL 5 MG/ML IJ SOLN
10.0000 mg | Freq: Once | INTRAMUSCULAR | Status: AC
  Administered 2013-09-27: 10 mg via INTRAVENOUS
  Filled 2013-09-27: qty 2

## 2013-09-27 MED ORDER — LACTATED RINGERS IV BOLUS (SEPSIS)
1000.0000 mL | Freq: Once | INTRAVENOUS | Status: AC
  Administered 2013-09-27: 1000 mL via INTRAVENOUS

## 2013-09-27 MED ORDER — METHYLPREDNISOLONE 4 MG PO KIT: PACK | ORAL | Status: DC

## 2013-09-27 NOTE — MAU Note (Signed)
Pt brought in by EMS. States n/v been going on for days. Pt states she has thrown up 12+ times tonight. States zofran will not stay down. Pt states given zofran odt but it "does not stay down." Pt states she also has rx for reglan but it has not helped. Also given a suppository but does not know the name of it. IV started by EMS.

## 2013-09-27 NOTE — MAU Provider Note (Signed)
History     CSN: [id number]  Arrival date and time: 09/27/13 [id number]   First Provider Initiated Contact with Patient 09/27/13 0533      Chief Complaint  Patient presents with  . Hyperemesis Gravidarum   HPI [name] is a 33 y.o. [id number] at 4734w4d who presents to MAU today by EMS for N/V. The patient has had N/V throughout the pregnancy and has been given numerous Rx. She states that she is taking Zofran, compazine and reglan without relief. She endorses 12+ episodes of vomiting overnight. She is also having mild diffuse abdominal pain. She denies fever, diarrhea, UTI symptoms, vaginal bleeding or discharge. She states mild headache and feeling lightheaded and weak today. The patient has been seen in MAU multiple times for this since the onset and has been known to be non-compliant with medications. Patient was given Zofran en-route and has saline lock in place.   OB History   Grav Para Term Preterm Abortions TAB SAB Ect Mult Living   4 2 2  0 1 1 0 0 0 2      Past Medical History  Diagnosis Date  . Scoliosis   . Asthma   . Abnormal Pap smear 2011    repeat WNL; cryo 20011    Past Surgical History  Procedure Laterality Date  . Hand surgery Left     Family History  Problem Relation Age of Onset  . Anesthesia problems Neg Hx   . Diabetes Mother   . Sickle cell anemia Mother     History  Substance Use Topics  . Smoking status: Current Every Day Smoker -- 0.35 packs/day    Types: Cigarettes  . Smokeless tobacco: Never Used  . Alcohol Use: 1.2 oz/week    2 Cans of beer per week     Comment: occasionally    Allergies:  Allergies  Allergen Reactions  . Promethazine Hcl Palpitations    Makes her jittery    Prescriptions prior to admission  Medication Sig Dispense Refill  . metoCLOPramide (REGLAN) 10 MG tablet Take 10 mg by mouth 4 (four) times daily.      . ondansetron (ZOFRAN ODT) 4 MG disintegrating tablet Take 1 tablet (4 mg total) by mouth every 8  (eight) hours as needed for nausea or vomiting.  20 tablet  0  . prochlorperazine (COMPAZINE) 25 MG suppository Place 1 suppository (25 mg total) rectally every 12 (twelve) hours as needed for nausea or vomiting.  12 suppository  0    Review of Systems  Constitutional: Positive for malaise/fatigue. Negative for fever.  Gastrointestinal: Positive for nausea, vomiting, abdominal pain and constipation. Negative for diarrhea.  Genitourinary: Negative for dysuria, urgency and frequency.       Neg - vaginal bleeding, discharge  Neurological: Positive for weakness.   Physical Exam   Blood pressure 105/54, pulse 65, temperature 98.3 F (36.8 C), temperature source Oral, resp. rate 16, height 4\' 11"  (1.499 m), weight 93 lb 9.6 oz (42.457 kg), last menstrual period 07/12/2013, SpO2 99.00%.  Physical Exam  Constitutional: She is oriented to person, place, and time. She appears well-developed and well-nourished. No distress.  HENT:  Head: Normocephalic and atraumatic.  Cardiovascular: Normal rate.   Respiratory: Effort normal.  GI: Soft. She exhibits no distension and no mass. Bowel sounds are decreased. There is tenderness (mild diffuse tenderness to palpation of the abdomen). There is no rebound and no guarding.  Neurological: She is alert and oriented to person, place, and time.  Skin:  Skin is warm and dry. No erythema.  Psychiatric: She has a normal mood and affect.   Results for orders placed during the hospital encounter of 09/27/13 (from the past 24 hour(s))  URINALYSIS, ROUTINE W REFLEX MICROSCOPIC     Status: Abnormal   Collection Time    09/27/13  5:10 AM      Result Value Ref Range   Color, Urine YELLOW  YELLOW   APPearance HAZY (*) CLEAR   Specific Gravity, Urine 1.015  1.005 - 1.030   pH >9.0 (*) 5.0 - 8.0   Glucose, UA NEGATIVE  NEGATIVE mg/dL   Hgb urine dipstick NEGATIVE  NEGATIVE   Bilirubin Urine NEGATIVE  NEGATIVE   Ketones, ur 15 (*) NEGATIVE mg/dL   Protein, ur 161  (*) NEGATIVE mg/dL   Urobilinogen, UA 0.2  0.0 - 1.0 mg/dL   Nitrite NEGATIVE  NEGATIVE   Leukocytes, UA NEGATIVE  NEGATIVE  URINE MICROSCOPIC-ADD ON     Status: Abnormal   Collection Time    09/27/13  5:10 AM      Result Value Ref Range   Squamous Epithelial / LPF RARE  RARE   WBC, UA 0-2  <3 WBC/hpf   RBC / HPF 0-2  <3 RBC/hpf   Bacteria, UA FEW (*) RARE   Discussed with Dr. Ellyn Hack- will send pt home on steriod taper dose MAU Course  Procedures None  MDM FHR - 155 bpm with doppler Per RN worksheets, patient weighed 94 lbs on 09/17/13 and weighs 93 lbs today Discussed with Dr. Ellyn Hack. Order 1 liter IV fluids with MV and start Medrol taper for hyperemesis 0800 - Patient getting IV fluids. Care turned over to Pamelia Hoit, NP Micah Flesher to see pt- asked is she was feeling better- she said "no, she was still nauseated" Another liter if IVF- LR ordered and Zofran 4mg  IV Social work and nutritional consult requested Pt refuses to eat and refuses to go home- pt has not vomited since she has been here Discussed with Dr. Jackelyn Knife- no reason to keep in hospital- will have social worker discuss with pt And set up a plan Pt very upset that she still is nauseated and vomiting Pt states she has been using Zofran 8mg  ODT but vomits it back up  Pt previously has used Reglan pump at home with her last pregnancy- that she had hyperemesis with Discussed with Dr. Meisinger/- Reglan 10mg  IV ordered Social Worker here to see pt Nurse Case worker here to see pt- will help set up Reglan IV home care Pt is feeling better after Reglan and feels like she can go home [facility] came to see pt to help set up IV Reglan Discussed Medrol taper with pt and will give schedule Soap Suds enema given but pt unable to tolerate much of the enema Freddi Starr, PA-C  09/27/2013, 5:48 AM  Assessment and Plan  Hyperemesis- IVF with antiemetics and Reglan Reglan IV home care with [facility]- to call  office to set up Medrol taper dose pack per hyperemesis protocol- Medrol 4mg  tablets #81 tabs Taper over 14 days- schedule given to pt F/u in office next week Pamelia Hoit WHNP/BC

## 2013-09-27 NOTE — Progress Notes (Signed)
CSW met with pt to assess her current social situation, after multiple visit to MAU.  Her chief complaint is nasuea & vomiting.  Pt states she "can't eat or can't keep anything down" when she does.  Her symptoms started a couple of weeks ago.  She has prescribed Zofran but it causes her to be constipated.  She does not like the side effects of Phenergan.  Pt was transported to MAU this morning via EMS.  She was accompanied by her 32 year old daughter.  Pts support system is limited to her grandmother (who recently started dialysis).  FOB is not supportive & she does not have any friends to help.  Pts father lives in the area & helps out when he can however pt states he is an alcoholic.  Pt is originally from [place].  Pt states she experienced the same symptoms her last pregnancy.  CSW does not identify any social reasons why pt continues to return to the hospital.  Medical provider plans to order home health services & medication.  CSW contacted case manager to request assistance with home health needs.  CSW able to provide pt with transportation home if needed.

## 2013-10-02 NOTE — Care Management Note (Signed)
    Page 1 of 1   09/27/2013     12:51:57 PM   CARE MANAGEMENT NOTE 09/27/2013  Patient:  [name], [name]   Account Number:  [id number]  Date Initiated:  09/27/2013  Documentation initiated by:  CRAFT,TERRI  Subjective/Objective Assessment:   33 year old female in MAU for hyperemesis     Action/Plan:   D/C when medically stable   Anticipated DC Date:  09/27/2013   Anticipated DC Plan:  [place] referral  Clinical Social Worker      DC Forensic scientist  CM consult      [facility] Choice  HOME HEALTH   Choice offered to / List presented to:  C-1 Patient   DME arranged  IV PUMP/EQUIPMENT      DME agency  [place] arranged  HH-1 RN      [place].   Status of service:  Completed, signed off  Discharge Disposition:  [place]  Comments:  09/27/13, Aida Raider RNC-MNN, BSN, [phone], CM received referral and met with pt. to discuss [facility] services and offer choice for [facility] services.  Pt states she has used [facility] before for Reglan pump and would like to use them again.  Marguerita Beards and Pam at [facility] contacted about orders and confirmation of services received.  Pt will be discharged from MAU.

## 2013-10-16 ENCOUNTER — Inpatient Hospital Stay (HOSPITAL_COMMUNITY)
Admission: AD | Admit: 2013-10-16 | Discharge: 2013-10-16 | Disposition: A | Discharge status: Home / Self Care | Payer: Medicaid Other | Source: Ambulatory Visit | Attending: Obstetrics and Gynecology | Admitting: Obstetrics and Gynecology

## 2013-10-16 DIAGNOSIS — O211 Hyperemesis gravidarum with metabolic disturbance: Secondary | ICD-10-CM

## 2013-10-16 DIAGNOSIS — O99330 Smoking (tobacco) complicating pregnancy, unspecified trimester: Secondary | ICD-10-CM | POA: Insufficient documentation

## 2013-10-16 DIAGNOSIS — O210 Mild hyperemesis gravidarum: Secondary | ICD-10-CM | POA: Insufficient documentation

## 2013-10-16 LAB — URINALYSIS, ROUTINE W REFLEX MICROSCOPIC
Bilirubin Urine: NEGATIVE
GLUCOSE, UA: 500 mg/dL — AB
Hgb urine dipstick: NEGATIVE
Ketones, ur: 15 mg/dL — AB
LEUKOCYTES UA: NEGATIVE
NITRITE: NEGATIVE
Protein, ur: NEGATIVE mg/dL
Specific Gravity, Urine: 1.015 (ref 1.005–1.030)
Urobilinogen, UA: 0.2 mg/dL (ref 0.0–1.0)

## 2013-10-16 MED ORDER — METHYLPREDNISOLONE 4 MG PO TABS: 8.0000 mg | ORAL_TABLET | Freq: Every day | ORAL | Status: DC

## 2013-10-16 MED ORDER — DIPHENHYDRAMINE HCL 50 MG/ML IJ SOLN
12.5000 mg | Freq: Once | INTRAMUSCULAR | Status: AC
  Administered 2013-10-16: 12.5 mg via INTRAVENOUS
  Filled 2013-10-16: qty 1

## 2013-10-16 MED ORDER — METHYLPREDNISOLONE 16 MG PO TABS: 16.0000 mg | ORAL_TABLET | Freq: Every day | ORAL | Status: DC

## 2013-10-16 MED ORDER — ONDANSETRON 4 MG PO TBDP: 4.0000 mg | ORAL_TABLET | Freq: Three times a day (TID) | ORAL | Status: DC | PRN

## 2013-10-16 MED ORDER — METHYLPREDNISOLONE 32 MG PO TABS
48.0000 mg | ORAL_TABLET | Freq: Once | ORAL | Status: AC
  Administered 2013-10-16: 48 mg via ORAL
  Filled 2013-10-16: qty 1

## 2013-10-16 MED ORDER — ONDANSETRON 8 MG/NS 50 ML IVPB
8.0000 mg | Freq: Once | INTRAVENOUS | Status: AC
  Administered 2013-10-16: 8 mg via INTRAVENOUS
  Filled 2013-10-16: qty 8

## 2013-10-16 MED ORDER — DOXYLAMINE SUCCINATE (SLEEP) 25 MG PO TABS: 12.5000 mg | ORAL_TABLET | Freq: Every evening | ORAL | Status: DC | PRN

## 2013-10-16 MED ORDER — METOCLOPRAMIDE HCL 5 MG/ML IJ SOLN
10.0000 mg | Freq: Once | INTRAMUSCULAR | Status: AC
  Administered 2013-10-16: 10 mg via INTRAVENOUS
  Filled 2013-10-16: qty 2

## 2013-10-16 MED ORDER — METHYLPREDNISOLONE 4 MG PO TABS: 4.0000 mg | ORAL_TABLET | Freq: Every day | ORAL | Status: DC

## 2013-10-16 MED ORDER — DEXTROSE 5 % IN LACTATED RINGERS IV BOLUS
1000.0000 mL | Freq: Once | INTRAVENOUS | Status: AC
  Administered 2013-10-16: 1000 mL via INTRAVENOUS

## 2013-10-16 MED ORDER — METHYLPREDNISOLONE 4 MG PO KIT: PACK | ORAL | Status: DC

## 2013-10-16 MED ORDER — LACTATED RINGERS IV SOLN
Freq: Once | INTRAVENOUS | Status: AC
  Administered 2013-10-16: 10:00:00 via INTRAVENOUS
  Filled 2013-10-16: qty 1000

## 2013-10-16 NOTE — MAU Note (Signed)
Patient arrived by EMS with c/o nausea and vomiting. Patient states she had a Reglan pump that she removed prior to EMS arrival.

## 2013-10-16 NOTE — Discharge Instructions (Signed)
Hyperemesis Gravidarum Diet °Hyperemesis gravidarum is a severe form of morning sickness. It is characterized by frequent and severe vomiting. It happens during the first trimester of pregnancy. It may be caused by the rapid hormone changes that happen during pregnancy. It is associated with a 5% weight loss of pre-pregnancy weight. The hyperemesis diet may be used to lessen symptoms of nausea and vomiting. °EATING GUIDELINES °· Eat 5 to 6 small meals daily instead of 3 large meals. °· Avoid foods with strong smells. °· Avoid drinking 30 minutes before and after meals. °· Avoid fried or high-fat foods, such as butter and cream sauces. °· Starchy foods are usually well-tolerated, such as cereal, toast, bread, potatoes, pasta, rice, and pretzels. °· Eat crackers before you get out of bed in the morning. °· Avoid spicy foods. °· Ginger may help with nausea. Add ¼ tsp ginger to hot tea or choose ginger tea. °· Continue to take your prenatal vitamins as directed by your caregiver. °SAMPLE MEAL PLAN °Breakfast  °· ½ cup oatmeal °· 1 slice toast °· 1 tsp heart-healthy margarine °· 1 tsp jelly °· 1 scrambled egg °Midmorning Snack  °· 1 cup low-fat yogurt °Lunch  °· Plain ham sandwich °· Carrot or celery sticks °· 1 small apple °· 3 graham crackers °Midafternoon Snack  °· Cheese and crackers °Dinner °· 4 oz pork tenderloin °· 1 small baked potato °· 1 tsp margarine °· ½ cup broccoli °· ½ cup grapes °Evening Snack °· 1 cup pudding °Document Released: 05/16/2007 Document Revised: 10/11/2011 Document Reviewed: 12/19/2012 °ExitCare® Patient Information ©2014 [employer]. ° °Hyperemesis Gravidarum °Hyperemesis gravidarum is a severe form of nausea and vomiting that happens during pregnancy. Hyperemesis is worse than morning sickness. It may cause you to have nausea or vomiting all day for many days. It may keep you from eating and drinking enough food and liquids. Hyperemesis usually occurs during the first half (the first 20  weeks) of pregnancy. It often goes away once a woman is in her second half of pregnancy. However, sometimes hyperemesis continues through an entire pregnancy.  °CAUSES  °The cause of this condition is not completely known but is thought to be related to changes in the body's hormones when pregnant. It could be from the high level of the pregnancy hormone or an increase in estrogen in the body.  °SIGNS AND SYMPTOMS  °· Severe nausea and vomiting. °· Nausea that does not go away. °· Vomiting that does not allow you to keep any food down. °· Weight loss and body fluid loss (dehydration). °· Having no desire to eat or not liking food you have previously enjoyed. °DIAGNOSIS  °Your health care provider will do a physical exam and ask you about your symptoms. He or she may also order blood tests and urine tests to make sure something else is not causing the problem.  °TREATMENT  °You may only need medicine to control the problem. If medicines do not control the nausea and vomiting, you will be treated in the hospital to prevent dehydration, increased acid in the blood (acidosis), weight loss, and changes in the electrolytes in your body that may harm the unborn baby (fetus). You may need IV fluids.  °HOME CARE INSTRUCTIONS  °· Only take over-the-counter or prescription medicines as directed by your health care provider. °· Try eating a couple of dry crackers or toast in the morning before getting out of bed. °· Avoid foods and smells that upset your stomach. °· Avoid fatty and   spicy foods. °· Eat 5 6 small meals a day. °· Do not drink when eating meals. Drink between meals. °· For snacks, eat high-protein foods, such as cheese. °· Eat or suck on things that have ginger in them. Ginger helps nausea. °· Avoid food preparation. The smell of food can spoil your appetite. °· Avoid iron pills and iron in your multivitamins until after 3 4 months of being pregnant. However, consult with your health care provider before stopping  any prescribed iron pills. °SEEK MEDICAL CARE IF:  °· Your abdominal pain increases. °· You have a severe headache. °· You have vision problems. °· You are losing weight. °SEEK IMMEDIATE MEDICAL CARE IF:  °· You are unable to keep fluids down. °· You vomit blood. °· You have constant nausea and vomiting. °· You have excessive weakness. °· You have extreme thirst. °· You have dizziness or fainting. °· You have a fever or persistent symptoms for more than 2 3 days. °· You have a fever and your symptoms suddenly get worse. °MAKE SURE YOU:  °· Understand these instructions. °· Will watch your condition. °· Will get help right away if you are not doing well or get worse. °Document Released: 07/19/2005 Document Revised: 05/09/2013 Document Reviewed: 02/28/2013 °ExitCare® Patient Information ©2014 [employer]. ° °

## 2013-10-16 NOTE — MAU Provider Note (Signed)
History     CSN: [id number]  Arrival date and time: 10/16/13 [id number]   First Provider Initiated Contact with Patient 10/16/13 0850      No chief complaint on file.  HPI  Ms. Jodell Laural BenesJohnson is a 33 y.o. female [phone] at 3042w2d who presents with ongoing nausea and vomiting in pregnancy. The patient has been to MAU numerous times and recently had a reglan pump placed that she took off prior to EMS being called. The patient is non-compliant with nausea medication; is unable to afford her Zofran and has stopped taking it.  The patient has not called her Dr. To let her know she was coming in. The patient presents once again with her 32 year old daughter and states that she cannot take her daughter to school because she is to sick. The patient each time requests a meal tray for her daughter to eat.  Pt stated that she now has no OB Dr. And needs to figure out where she is going to go for prenatal care. Pt states she will call her primary care Dr. To determine what to do from here. Pt would like a list of local OB providers.   OB History   Grav Para Term Preterm Abortions TAB SAB Ect Mult Living   4 2 2  0 1 1 0 0 0 2      Past Medical History  Diagnosis Date  . Scoliosis   . Asthma   . Abnormal Pap smear 2011    repeat WNL; cryo 20011    Past Surgical History  Procedure Laterality Date  . Hand surgery Left     Family History  Problem Relation Age of Onset  . Anesthesia problems Neg Hx   . Diabetes Mother   . Sickle cell anemia Mother     History  Substance Use Topics  . Smoking status: Current Every Day Smoker -- 0.35 packs/day    Types: Cigarettes  . Smokeless tobacco: Never Used  . Alcohol Use: 1.2 oz/week    2 Cans of beer per week     Comment: occasionally    Allergies:  Allergies  Allergen Reactions  . Promethazine Hcl Palpitations    Makes her jittery    Prescriptions prior to admission  Medication Sig Dispense Refill  . metoCLOPramide (REGLAN) 10 MG tablet  Take 10 mg by mouth 4 (four) times daily.      . ondansetron (ZOFRAN ODT) 4 MG disintegrating tablet Take 1 tablet (4 mg total) by mouth every 8 (eight) hours as needed for nausea or vomiting.  20 tablet  0   Results for orders placed during the hospital encounter of 10/16/13 (from the past 48 hour(s))  URINALYSIS, ROUTINE W REFLEX MICROSCOPIC     Status: Abnormal   Collection Time    10/16/13  9:30 AM      Result Value Ref Range   Color, Urine YELLOW  YELLOW   APPearance CLOUDY (*) CLEAR   Specific Gravity, Urine 1.015  1.005 - 1.030   pH >9.0 (*) 5.0 - 8.0   Glucose, UA 500 (*) NEGATIVE mg/dL   Hgb urine dipstick NEGATIVE  NEGATIVE   Bilirubin Urine NEGATIVE  NEGATIVE   Ketones, ur 15 (*) NEGATIVE mg/dL   Protein, ur NEGATIVE  NEGATIVE mg/dL   Urobilinogen, UA 0.2  0.0 - 1.0 mg/dL   Nitrite NEGATIVE  NEGATIVE   Leukocytes, UA NEGATIVE  NEGATIVE   Comment: MICROSCOPIC NOT DONE ON URINES WITH NEGATIVE PROTEIN, BLOOD, LEUKOCYTES,  NITRITE, OR GLUCOSE <1000 mg/dL.    Review of Systems  Constitutional: Negative for fever and chills.  Gastrointestinal: Positive for nausea, vomiting and constipation. Negative for heartburn, abdominal pain and diarrhea.  Genitourinary: Negative for dysuria, urgency, frequency and hematuria.       No vaginal discharge. No vaginal bleeding. No dysuria.    Physical Exam   Blood pressure 108/57, pulse 102, temperature 98.1 F (36.7 C), temperature source Oral, resp. rate 18, last menstrual period 07/12/2013.  Physical Exam  Constitutional: She is oriented to person, place, and time. She has a sickly appearance. She appears ill. She appears distressed.  HENT:  Head: Normocephalic.  Eyes: Pupils are equal, round, and reactive to light.  Neck: Neck supple.  Respiratory: Effort normal.  Musculoskeletal: Normal range of motion.  Neurological: She is alert and oriented to person, place, and time.  Skin: She is not diaphoretic.  Psychiatric: Her mood  appears anxious. Her affect is angry and inappropriate. She is agitated. She expresses inappropriate judgment.    MAU Course  Procedures None  MDM Fetal heart rate by doppler: 146 bpm  UA shows mild dehydration.  Discussed with Dr. Senaida Ores the plan of care.   I will offer the patient a steroid taper although I don't anticipate the patient will be compliant with taking it at home.  1040: Np to the room to talk to the patient about the plan of care. The patient requests "something to make her sleep". I told her I could given her a small dose of benadryl and that we would let the fluid bag finish and DC her home. The patient became very upset, sitting up in bed stating that no one here understands that she has hyperemesis. I discussed with the patient that we are concerned that she is not discussing issues with her OBGYN (ie. Running out of medication, taking the pump out without discussing this) patient then states that the practice she was in has dismissed her.   Discussed with Dr. Senaida Ores that patient is feeling much better. I have given the patient a medrol dose taper and went over in detail the 14 day schedule; pt voices understanding. I have given patient Zofran RX with 2 refills and encouraged patient to call home health to have the reglan pump re-inserted  Assessment and Plan   A:  1. Hyperemesis gravidarum before end of [redacted] week gestation, dehydration    P:  Discharge home RX: Medrol taper; instructions given to patient        [place]         Zofran  A list of providers given to the patient per her requests  Return to MAU as needed, if symptoms worsen   Iona Hansen Rasch, NP  10/16/2013, 1:30 PM

## 2013-11-07 ENCOUNTER — Inpatient Hospital Stay (HOSPITAL_COMMUNITY)
Admission: AD | Admit: 2013-11-07 | Discharge: 2013-11-07 | Disposition: A | Discharge status: Home / Self Care | Payer: Medicaid Other | Source: Ambulatory Visit | Attending: Family Medicine | Admitting: Family Medicine

## 2013-11-07 ENCOUNTER — Encounter (HOSPITAL_COMMUNITY): Payer: Self-pay | Admitting: *Deleted

## 2013-11-07 DIAGNOSIS — O99330 Smoking (tobacco) complicating pregnancy, unspecified trimester: Secondary | ICD-10-CM | POA: Insufficient documentation

## 2013-11-07 DIAGNOSIS — M4120 Other idiopathic scoliosis, site unspecified: Secondary | ICD-10-CM | POA: Insufficient documentation

## 2013-11-07 DIAGNOSIS — O210 Mild hyperemesis gravidarum: Secondary | ICD-10-CM | POA: Insufficient documentation

## 2013-11-07 DIAGNOSIS — J45909 Unspecified asthma, uncomplicated: Secondary | ICD-10-CM | POA: Insufficient documentation

## 2013-11-07 LAB — URINE MICROSCOPIC-ADD ON

## 2013-11-07 LAB — URINALYSIS, ROUTINE W REFLEX MICROSCOPIC
Bilirubin Urine: NEGATIVE
GLUCOSE, UA: NEGATIVE mg/dL
KETONES UR: 15 mg/dL — AB
LEUKOCYTES UA: NEGATIVE
NITRITE: NEGATIVE
Protein, ur: NEGATIVE mg/dL
Specific Gravity, Urine: 1.025 (ref 1.005–1.030)
UROBILINOGEN UA: 0.2 mg/dL (ref 0.0–1.0)
pH: 6.5 (ref 5.0–8.0)

## 2013-11-07 MED ORDER — ONDANSETRON HCL 4 MG PO TABS: 8.0000 mg | ORAL_TABLET | Freq: Once | ORAL | Status: DC

## 2013-11-07 MED ORDER — ONDANSETRON 8 MG PO TBDP
8.0000 mg | ORAL_TABLET | Freq: Once | ORAL | Status: AC
  Administered 2013-11-07: 8 mg via ORAL
  Filled 2013-11-07: qty 1

## 2013-11-07 MED ORDER — METOCLOPRAMIDE HCL 10 MG PO TABS
10.0000 mg | ORAL_TABLET | Freq: Once | ORAL | Status: AC
  Administered 2013-11-07: 10 mg via ORAL
  Filled 2013-11-07: qty 1

## 2013-11-07 MED ORDER — ONDANSETRON 4 MG PO TBDP: 4.0000 mg | ORAL_TABLET | Freq: Three times a day (TID) | ORAL | Status: DC | PRN

## 2013-11-07 MED ORDER — METOCLOPRAMIDE HCL 10 MG PO TABS: 10.0000 mg | ORAL_TABLET | Freq: Four times a day (QID) | ORAL | Status: DC

## 2013-11-07 NOTE — MAU Note (Signed)
C/o nausea; out of her rx of Zofran; hyperemesis pt; switching OB doctors and needs Zofran rx until she is able to get in with her new OB; requesting a  Zofran 8 mg ODT before her vomiting starts;

## 2013-11-07 NOTE — MAU Provider Note (Signed)
History     CSN: [id number]  Arrival date and time: 11/07/13 1051   First Provider Initiated Contact with Patient 11/07/13 1211      Chief Complaint  Patient presents with  . Nausea   HPI  [name] is a 33 y.o. female [phone] at 2929w3d who presents with nausea. The patient has a history of hyperemesis with this pregnancy. States she has been doing well for a while and able to manage her symptoms at home with Zofran. She has been on steroid tapers and feels that has helped her tremendously. [facility], however has been dismissed from the practice. The patient, due to insurance reasons has not found another Dr. for this pregnancy. We discussed referring her to the clinic downstairs and the patient agrees. She has run out of Zofran and wanted to come here for a refill before her nausea became worse.   OB History   Grav Para Term Preterm Abortions TAB SAB Ect Mult Living   4 2 2  0 1 1 0 0 0 2      Past Medical History  Diagnosis Date  . Scoliosis   . Asthma   . Abnormal Pap smear 2011    repeat WNL; cryo 20011    Past Surgical History  Procedure Laterality Date  . Hand surgery Left     Family History  Problem Relation Age of Onset  . Anesthesia problems Neg Hx   . Diabetes Mother   . Sickle cell anemia Mother     History  Substance Use Topics  . Smoking status: Current Every Day Smoker -- 0.35 packs/day    Types: Cigarettes  . Smokeless tobacco: Never Used  . Alcohol Use: 1.2 oz/week    2 Cans of beer per week     Comment: occasionally    Allergies:  Allergies  Allergen Reactions  . Promethazine Hcl Palpitations    Makes her jittery    Prescriptions prior to admission  Medication Sig Dispense Refill  . doxylamine, Sleep, (UNISOM) 25 MG tablet Take 0.5 tablets (12.5 mg total) by mouth at bedtime as needed.  30 tablet  0  . methylPREDNISolone (MEDROL DOSEPAK) 4 MG tablet See patient instructions given at discharge.  81  tablet  0  . metoCLOPramide (REGLAN) 10 MG tablet Take 10 mg by mouth 4 (four) times daily.      . ondansetron (ZOFRAN ODT) 4 MG disintegrating tablet Take 1 tablet (4 mg total) by mouth every 8 (eight) hours as needed for nausea or vomiting.  20 tablet  2   Results for orders placed during the hospital encounter of 11/07/13 (from the past 48 hour(s))  URINALYSIS, ROUTINE W REFLEX MICROSCOPIC     Status: Abnormal   Collection Time    11/07/13 12:30 PM      Result Value Ref Range   Color, Urine YELLOW  YELLOW   APPearance CLOUDY (*) CLEAR   Specific Gravity, Urine 1.025  1.005 - 1.030   pH 6.5  5.0 - 8.0   Glucose, UA NEGATIVE  NEGATIVE mg/dL   Hgb urine dipstick TRACE (*) NEGATIVE   Bilirubin Urine NEGATIVE  NEGATIVE   Ketones, ur 15 (*) NEGATIVE mg/dL   Protein, ur NEGATIVE  NEGATIVE mg/dL   Urobilinogen, UA 0.2  0.0 - 1.0 mg/dL   Nitrite NEGATIVE  NEGATIVE   Leukocytes, UA NEGATIVE  NEGATIVE  URINE MICROSCOPIC-ADD ON     Status: Abnormal   Collection Time  11/07/13 12:30 PM      Result Value Ref Range   Squamous Epithelial / LPF MANY (*) RARE   WBC, UA 0-2  <3 WBC/hpf   RBC / HPF 0-2  <3 RBC/hpf   Bacteria, UA FEW (*) RARE   Urine-Other MUCOUS PRESENT       Review of Systems  Constitutional: Negative for fever and chills.  Gastrointestinal: Positive for nausea and vomiting. Negative for abdominal pain, diarrhea and constipation.  Genitourinary: Negative for dysuria, urgency, frequency and hematuria.       No vaginal discharge. No vaginal bleeding. No dysuria.    Physical Exam   Blood pressure 105/53, pulse 97, temperature 97.9 F (36.6 C), temperature source Oral, resp. rate 16, height 5' 0.25" (1.53 m), weight 44.634 kg (98 lb 6.4 oz), last menstrual period 07/12/2013.  Physical Exam  Constitutional: She is oriented to person, place, and time. She appears well-developed and well-nourished. No distress.  HENT:  Head: Normocephalic.  Eyes: Pupils are equal,  round, and reactive to light.  Neck: Neck supple.  Respiratory: Effort normal.  Musculoskeletal: Normal range of motion.  Neurological: She is alert and oriented to person, place, and time.  Skin: Skin is warm. She is not diaphoretic.  Psychiatric: Her behavior is normal.    MAU Course  Procedures None  MDM +fht  Pt requests Zofran 8 mg ODT  1250: patient unable to wait for urine results and says she has to leave. RX sent to pharmacy as patient requested.   Assessment and Plan   A:  Hyperemesis in pregnancy   P:  Discharge home in stable condition RX: Zofran         Reglan Return to MAU as needed, if symptoms worsen  Referral sent to the clinic    [name] 11/07/2013, 12:11 PM

## 2013-11-07 NOTE — Discharge Instructions (Signed)
Hyperemesis Gravidarum  Hyperemesis gravidarum is a severe form of nausea and vomiting that happens during pregnancy. Hyperemesis is worse than morning sickness. It may cause you to have nausea or vomiting all day for many days. It may keep you from eating and drinking enough food and liquids. Hyperemesis usually occurs during the first half (the first 20 weeks) of pregnancy. It often goes away once a woman is in her second half of pregnancy. However, sometimes hyperemesis continues through an entire pregnancy.   CAUSES   The cause of this condition is not completely known but is thought to be related to changes in the body's hormones when pregnant. It could be from the high level of the pregnancy hormone or an increase in estrogen in the body.   SIGNS AND SYMPTOMS   · Severe nausea and vomiting.  · Nausea that does not go away.  · Vomiting that does not allow you to keep any food down.  · Weight loss and body fluid loss (dehydration).  · Having no desire to eat or not liking food you have previously enjoyed.  DIAGNOSIS   Your health care provider will do a physical exam and ask you about your symptoms. He or she may also order blood tests and urine tests to make sure something else is not causing the problem.   TREATMENT   You may only need medicine to control the problem. If medicines do not control the nausea and vomiting, you will be treated in the hospital to prevent dehydration, increased acid in the blood (acidosis), weight loss, and changes in the electrolytes in your body that may harm the unborn baby (fetus). You may need IV fluids.   HOME CARE INSTRUCTIONS   · Only take over-the-counter or prescription medicines as directed by your health care provider.  · Try eating a couple of dry crackers or toast in the morning before getting out of bed.  · Avoid foods and smells that upset your stomach.  · Avoid fatty and spicy foods.  · Eat 5 6 small meals a day.  · Do not drink when eating meals. Drink between  meals.  · For snacks, eat high-protein foods, such as cheese.  · Eat or suck on things that have ginger in them. Ginger helps nausea.  · Avoid food preparation. The smell of food can spoil your appetite.  · Avoid iron pills and iron in your multivitamins until after 3 4 months of being pregnant. However, consult with your health care provider before stopping any prescribed iron pills.  SEEK MEDICAL CARE IF:   · Your abdominal pain increases.  · You have a severe headache.  · You have vision problems.  · You are losing weight.  SEEK IMMEDIATE MEDICAL CARE IF:   · You are unable to keep fluids down.  · You vomit blood.  · You have constant nausea and vomiting.  · You have excessive weakness.  · You have extreme thirst.  · You have dizziness or fainting.  · You have a fever or persistent symptoms for more than 2 3 days.  · You have a fever and your symptoms suddenly get worse.  MAKE SURE YOU:   · Understand these instructions.  · Will watch your condition.  · Will get help right away if you are not doing well or get worse.  Document Released: 07/19/2005 Document Revised: 05/09/2013 Document Reviewed: 02/28/2013  ExitCare® Patient Information ©2014 [employer].

## 2013-11-07 NOTE — MAU Note (Signed)
Provider list given

## 2013-11-07 NOTE — MAU Note (Addendum)
In the process of changing dr's. Is a hyperemesis patien- has ran out of meds.  Wants to get on something before things get bad.  Some nausea today.

## 2013-11-08 NOTE — MAU Provider Note (Signed)
Attestation of Attending Supervision of Advanced Practitioner (PA/CNM/NP): Evaluation and management procedures were performed by the Advanced Practitioner under my supervision and collaboration.  I have reviewed the Advanced Practitioner's note and chart, and I agree with the management and plan.  Karn Derk, DO Attending Physician Faculty Practice, [facility]   

## 2013-11-12 ENCOUNTER — Other Ambulatory Visit (HOSPITAL_COMMUNITY): Payer: Self-pay | Admitting: Medical

## 2013-12-04 ENCOUNTER — Ambulatory Visit (INDEPENDENT_AMBULATORY_CARE_PROVIDER_SITE_OTHER): Payer: Medicaid Other | Admitting: Family Medicine

## 2013-12-04 ENCOUNTER — Encounter: Payer: Self-pay | Admitting: Family Medicine

## 2013-12-04 VITALS — BP 93/49 | HR 89 | Temp 98.7°F | Wt 106.4 lb

## 2013-12-04 DIAGNOSIS — Z3480 Encounter for supervision of other normal pregnancy, unspecified trimester: Secondary | ICD-10-CM

## 2013-12-04 DIAGNOSIS — F1920 Other psychoactive substance dependence, uncomplicated: Secondary | ICD-10-CM

## 2013-12-04 DIAGNOSIS — O99320 Drug use complicating pregnancy, unspecified trimester: Secondary | ICD-10-CM

## 2013-12-04 DIAGNOSIS — Z3490 Encounter for supervision of normal pregnancy, unspecified, unspecified trimester: Secondary | ICD-10-CM

## 2013-12-04 LAB — POCT URINALYSIS DIP (DEVICE)
BILIRUBIN URINE: NEGATIVE
Glucose, UA: NEGATIVE mg/dL
KETONES UR: NEGATIVE mg/dL
Leukocytes, UA: NEGATIVE
Nitrite: NEGATIVE
PH: 6.5 (ref 5.0–8.0)
PROTEIN: NEGATIVE mg/dL
SPECIFIC GRAVITY, URINE: 1.025 (ref 1.005–1.030)
Urobilinogen, UA: 0.2 mg/dL (ref 0.0–1.0)

## 2013-12-04 MED ORDER — ONDANSETRON 4 MG PO TBDP: 4.0000 mg | ORAL_TABLET | Freq: Three times a day (TID) | ORAL | Status: DC | PRN

## 2013-12-04 NOTE — Progress Notes (Signed)
Pt states she received some initial prenatal care with Dr. Ambrose MantleHenley, ROI signed and will call office to retrieve any labs.

## 2013-12-04 NOTE — Patient Instructions (Signed)
Second Trimester of Pregnancy The second trimester is from week 13 through week 28, months 4 through 6. The second trimester is often a time when you feel your best. Your body has also adjusted to being pregnant, and you begin to feel better physically. Usually, morning sickness has lessened or quit completely, you may have more energy, and you may have an increase in appetite. The second trimester is also a time when the fetus is growing rapidly. At the end of the sixth month, the fetus is about 9 inches long and weighs about 1 pounds. You will likely begin to feel the baby move (quickening) between 18 and 20 weeks of the pregnancy. BODY CHANGES Your body goes through many changes during pregnancy. The changes vary from woman to woman.   Your weight will continue to increase. You will notice your lower abdomen bulging out.  You may begin to get stretch marks on your hips, abdomen, and breasts.  You may develop headaches that can be relieved by medicines approved by your caregiver.  You may urinate more often because the fetus is pressing on your bladder.  You may develop or continue to have heartburn as a result of your pregnancy.  You may develop constipation because certain hormones are causing the muscles that push waste through your intestines to slow down.  You may develop hemorrhoids or swollen, bulging veins (varicose veins).  You may have back pain because of the weight gain and pregnancy hormones relaxing your joints between the bones in your pelvis and as a result of a shift in weight and the muscles that support your balance.  Your breasts will continue to grow and be tender.  Your gums may bleed and may be sensitive to brushing and flossing.  Dark spots or blotches (chloasma, mask of pregnancy) may develop on your face. This will likely fade after the baby is born.  A dark line from your belly button to the pubic area (linea nigra) may appear. This will likely fade after the  baby is born. WHAT TO EXPECT AT YOUR PRENATAL VISITS During a routine prenatal visit:  You will be weighed to make sure you and the fetus are growing normally.  Your blood pressure will be taken.  Your abdomen will be measured to track your baby's growth.  The fetal heartbeat will be listened to.  Any test results from the previous visit will be discussed. Your caregiver may ask you:  How you are feeling.  If you are feeling the baby move.  If you have had any abnormal symptoms, such as leaking fluid, bleeding, severe headaches, or abdominal cramping.  If you have any questions. Other tests that may be performed during your second trimester include:  Blood tests that check for:  Low iron levels (anemia).  Gestational diabetes (between 24 and 28 weeks).  Rh antibodies.  Urine tests to check for infections, diabetes, or protein in the urine.  An ultrasound to confirm the proper growth and development of the baby.  An amniocentesis to check for possible genetic problems.  Fetal screens for spina bifida and Down syndrome. HOME CARE INSTRUCTIONS   Avoid all smoking, herbs, alcohol, and unprescribed drugs. These chemicals affect the formation and growth of the baby.  Follow your caregiver's instructions regarding medicine use. There are medicines that are either safe or unsafe to take during pregnancy.  Exercise only as directed by your caregiver. Experiencing uterine cramps is a good sign to stop exercising.  Continue to eat regular,   healthy meals.  Wear a good support bra for breast tenderness.  Do not use hot tubs, steam rooms, or saunas.  Wear your seat belt at all times when driving.  Avoid raw meat, uncooked cheese, cat litter boxes, and soil used by cats. These carry germs that can cause birth defects in the baby.  Take your prenatal vitamins.  Try taking a stool softener (if your caregiver approves) if you develop constipation. Eat more high-fiber foods,  such as fresh vegetables or fruit and whole grains. Drink plenty of fluids to keep your urine clear or pale yellow.  Take warm sitz baths to soothe any pain or discomfort caused by hemorrhoids. Use hemorrhoid cream if your caregiver approves.  If you develop varicose veins, wear support hose. Elevate your feet for 15 minutes, 3 4 times a day. Limit salt in your diet.  Avoid heavy lifting, wear low heel shoes, and practice good posture.  Rest with your legs elevated if you have leg cramps or low back pain.  Visit your dentist if you have not gone yet during your pregnancy. Use a soft toothbrush to brush your teeth and be gentle when you floss.  A sexual relationship may be continued unless your caregiver directs you otherwise.  Continue to go to all your prenatal visits as directed by your caregiver. SEEK MEDICAL CARE IF:   You have dizziness.  You have mild pelvic cramps, pelvic pressure, or nagging pain in the abdominal area.  You have persistent nausea, vomiting, or diarrhea.  You have a bad smelling vaginal discharge.  You have pain with urination. SEEK IMMEDIATE MEDICAL CARE IF:   You have a fever.  You are leaking fluid from your vagina.  You have spotting or bleeding from your vagina.  You have severe abdominal cramping or pain.  You have rapid weight gain or loss.  You have shortness of breath with chest pain.  You notice sudden or extreme swelling of your face, hands, ankles, feet, or legs.  You have not felt your baby move in over an hour.  You have severe headaches that do not go away with medicine.  You have vision changes. Document Released: 07/13/2001 Document Revised: 03/21/2013 Document Reviewed: 09/19/2012 ExitCare Patient Information 2014 [employer].  

## 2013-12-04 NOTE — Progress Notes (Signed)
  Subjective:    [name] is being seen today for her first obstetrical visit.  She is a transfer [facility].  This is a planned pregnancy. She is at 5047w2d gestation. Her obstetrical history is significant for hyperemesis. she was placed on reglan pump for awhile.  now off.  currently taking zofran and reglan but just taking the zofran. Symptoms are doing better from [redacted] weeks GA. Not throwing up as much as she used too.  Relationship with FOB: partially involved. Patient does intend to breast feed. Pregnancy history fully reviewed.  Patient reports no bleeding and no cramping. Some occasional nausea.   No fevers, chills, chest pain, shortness of breath, LEE, abd pain.    Review of Systems:   Review of Systems See above   Objective:     BP 93/49  Pulse 89  Temp(Src) 98.7 F (37.1 C)  Wt 106 lb 6.4 oz (48.263 kg)  LMP 07/12/2013 Physical Exam  Exam GENERAL: Well-developed, well-nourished female in no acute distress.  HEENT: Normocephalic, atraumatic. Sclerae anicteric.  NECK: Supple. Normal thyroid.  LUNGS: Clear to auscultation bilaterally.  HEART: Regular rate and rhythm. BREASTS: deferred  ABDOMEN: Soft, nontender, nondistended. No organomegaly. PELVIC: deferred as done by Dr. Ambrose MantleHenley in new OBV EXTREMITIES: No cyanosis, clubbing, or edema, 2+ distal pulses.    Assessment:    Pregnancy: [id number] Patient Active Problem List   Diagnosis Date Noted  . Supervision of normal subsequent pregnancy 12/04/2013  . 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 H/O Bell's palsy 11/14/2012       Plan:     Records requested from initial PNV with [facility] Prenatal vitamins. Problem list reviewed and updated. Role of ultrasound in pregnancy discussed; fetal survey: requested. Follow up in 4 weeks. 50% of 30 min visit spent on counseling and coordination of care.     Rivka Baune L Belva Koziel 12/04/2013

## 2013-12-06 LAB — CULTURE, OB URINE: Colony Count: >=100000

## 2013-12-06 LAB — CANNABANOIDS (GC/LC/MS), URINE: THC-COOH (GC/LC/MS), ur confirm: >1500 ng/mL — AB (ref <5)

## 2013-12-07 ENCOUNTER — Encounter: Payer: Self-pay | Admitting: Family Medicine

## 2013-12-07 ENCOUNTER — Ambulatory Visit (HOSPITAL_COMMUNITY)
Admission: RE | Admit: 2013-12-07 | Discharge: 2013-12-07 | Disposition: A | Discharge status: Home / Self Care | Payer: Medicaid Other | Source: Ambulatory Visit | Attending: Family Medicine | Admitting: Family Medicine

## 2013-12-07 DIAGNOSIS — F1210 Cannabis abuse, uncomplicated: Secondary | ICD-10-CM | POA: Insufficient documentation

## 2013-12-07 DIAGNOSIS — Z3480 Encounter for supervision of other normal pregnancy, unspecified trimester: Secondary | ICD-10-CM

## 2013-12-07 DIAGNOSIS — Z3689 Encounter for other specified antenatal screening: Secondary | ICD-10-CM | POA: Insufficient documentation

## 2013-12-07 DIAGNOSIS — Z3490 Encounter for supervision of normal pregnancy, unspecified, unspecified trimester: Secondary | ICD-10-CM

## 2013-12-07 LAB — PRESCRIPTION MONITORING PROFILE (19 PANEL)
AMPHETAMINE/METH: NEGATIVE ng/mL
Barbiturate Screen, Urine: NEGATIVE ng/mL
Benzodiazepine Screen, Urine: NEGATIVE ng/mL
Buprenorphine, Urine: NEGATIVE ng/mL
CREATININE, URINE: 186.46 mg/dL (ref >20.0)
Carisoprodol, Urine: NEGATIVE ng/mL
Cocaine Metabolites: NEGATIVE ng/mL
FENTANYL URINE: NEGATIVE ng/mL
MDMA URINE: NEGATIVE ng/mL
METHADONE SCREEN, URINE: NEGATIVE ng/mL
Meperidine, Ur: NEGATIVE ng/mL
Methaqualone: NEGATIVE ng/mL
NITRITES URINE, INITIAL: NEGATIVE ug/mL
OPIATE SCREEN, URINE: NEGATIVE ng/mL
OXYCODONE SCRN UR: NEGATIVE ng/mL
PROPOXYPHENE: NEGATIVE ng/mL
Phencyclidine, Ur: NEGATIVE ng/mL
Tapentadol, urine: NEGATIVE ng/mL
Tramadol Scrn, Ur: NEGATIVE ng/mL
ZOLPIDEM, URINE: NEGATIVE ng/mL
pH, Initial: 6.8 pH (ref 4.5–8.9)

## 2013-12-11 DIAGNOSIS — Z3480 Encounter for supervision of other normal pregnancy, unspecified trimester: Secondary | ICD-10-CM

## 2013-12-12 ENCOUNTER — Encounter: Payer: Self-pay | Admitting: *Deleted

## 2014-01-02 ENCOUNTER — Ambulatory Visit (INDEPENDENT_AMBULATORY_CARE_PROVIDER_SITE_OTHER): Payer: Medicaid Other | Admitting: Advanced Practice Midwife

## 2014-01-02 ENCOUNTER — Encounter: Payer: Self-pay | Admitting: Advanced Practice Midwife

## 2014-01-02 VITALS — BP 94/55 | HR 96 | Wt 104.5 lb

## 2014-01-02 DIAGNOSIS — O210 Mild hyperemesis gravidarum: Secondary | ICD-10-CM

## 2014-01-02 DIAGNOSIS — M419 Scoliosis, unspecified: Secondary | ICD-10-CM

## 2014-01-02 DIAGNOSIS — M4120 Other idiopathic scoliosis, site unspecified: Secondary | ICD-10-CM

## 2014-01-02 DIAGNOSIS — D582 Other hemoglobinopathies: Secondary | ICD-10-CM | POA: Insufficient documentation

## 2014-01-02 LAB — POCT URINALYSIS DIP (DEVICE)
BILIRUBIN URINE: NEGATIVE
GLUCOSE, UA: NEGATIVE mg/dL
HGB URINE DIPSTICK: NEGATIVE
KETONES UR: 40 mg/dL — AB
Leukocytes, UA: NEGATIVE
Nitrite: NEGATIVE
Protein, ur: 30 mg/dL — AB
SPECIFIC GRAVITY, URINE: 1.02 (ref 1.005–1.030)
Urobilinogen, UA: 0.2 mg/dL (ref 0.0–1.0)
pH: 7 (ref 5.0–8.0)

## 2014-01-02 MED ORDER — ONDANSETRON 8 MG PO TBDP: 8.0000 mg | ORAL_TABLET | Freq: Three times a day (TID) | ORAL | Status: DC | PRN

## 2014-01-02 NOTE — Progress Notes (Signed)
Prenatal records received and reviewed, problem list updated to include Hgb C Trait, hyperemesis, and scoliosis.   Refilled zofran Rx. Cautioned about constipation, may use Miralax PRN

## 2014-01-02 NOTE — Patient Instructions (Signed)
Second Trimester of Pregnancy The second trimester is from week 13 through week 28, months 4 through 6. The second trimester is often a time when you feel your best. Your body has also adjusted to being pregnant, and you begin to feel better physically. Usually, morning sickness has lessened or quit completely, you may have more energy, and you may have an increase in appetite. The second trimester is also a time when the fetus is growing rapidly. At the end of the sixth month, the fetus is about 9 inches long and weighs about 1 pounds. You will likely begin to feel the baby move (quickening) between 18 and 20 weeks of the pregnancy. BODY CHANGES Your body goes through many changes during pregnancy. The changes vary from woman to woman.   Your weight will continue to increase. You will notice your lower abdomen bulging out.  You may begin to get stretch marks on your hips, abdomen, and breasts.  You may develop headaches that can be relieved by medicines approved by your caregiver.  You may urinate more often because the fetus is pressing on your bladder.  You may develop or continue to have heartburn as a result of your pregnancy.  You may develop constipation because certain hormones are causing the muscles that push waste through your intestines to slow down.  You may develop hemorrhoids or swollen, bulging veins (varicose veins).  You may have back pain because of the weight gain and pregnancy hormones relaxing your joints between the bones in your pelvis and as a result of a shift in weight and the muscles that support your balance.  Your breasts will continue to grow and be tender.  Your gums may bleed and may be sensitive to brushing and flossing.  Dark spots or blotches (chloasma, mask of pregnancy) may develop on your face. This will likely fade after the baby is born.  A dark line from your belly button to the pubic area (linea nigra) may appear. This will likely fade after the  baby is born. WHAT TO EXPECT AT YOUR PRENATAL VISITS During a routine prenatal visit:  You will be weighed to make sure you and the fetus are growing normally.  Your blood pressure will be taken.  Your abdomen will be measured to track your baby's growth.  The fetal heartbeat will be listened to.  Any test results from the previous visit will be discussed. Your caregiver may ask you:  How you are feeling.  If you are feeling the baby move.  If you have had any abnormal symptoms, such as leaking fluid, bleeding, severe headaches, or abdominal cramping.  If you have any questions. Other tests that may be performed during your second trimester include:  Blood tests that check for:  Low iron levels (anemia).  Gestational diabetes (between 24 and 28 weeks).  Rh antibodies.  Urine tests to check for infections, diabetes, or protein in the urine.  An ultrasound to confirm the proper growth and development of the baby.  An amniocentesis to check for possible genetic problems.  Fetal screens for spina bifida and Down syndrome. HOME CARE INSTRUCTIONS   Avoid all smoking, herbs, alcohol, and unprescribed drugs. These chemicals affect the formation and growth of the baby.  Follow your caregiver's instructions regarding medicine use. There are medicines that are either safe or unsafe to take during pregnancy.  Exercise only as directed by your caregiver. Experiencing uterine cramps is a good sign to stop exercising.  Continue to eat regular,   healthy meals.  Wear a good support bra for breast tenderness.  Do not use hot tubs, steam rooms, or saunas.  Wear your seat belt at all times when driving.  Avoid raw meat, uncooked cheese, cat litter boxes, and soil used by cats. These carry germs that can cause birth defects in the baby.  Take your prenatal vitamins.  Try taking a stool softener (if your caregiver approves) if you develop constipation. Eat more high-fiber foods,  such as fresh vegetables or fruit and whole grains. Drink plenty of fluids to keep your urine clear or pale yellow.  Take warm sitz baths to soothe any pain or discomfort caused by hemorrhoids. Use hemorrhoid cream if your caregiver approves.  If you develop varicose veins, wear support hose. Elevate your feet for 15 minutes, 3 4 times a day. Limit salt in your diet.  Avoid heavy lifting, wear low heel shoes, and practice good posture.  Rest with your legs elevated if you have leg cramps or low back pain.  Visit your dentist if you have not gone yet during your pregnancy. Use a soft toothbrush to brush your teeth and be gentle when you floss.  A sexual relationship may be continued unless your caregiver directs you otherwise.  Continue to go to all your prenatal visits as directed by your caregiver. SEEK MEDICAL CARE IF:   You have dizziness.  You have mild pelvic cramps, pelvic pressure, or nagging pain in the abdominal area.  You have persistent nausea, vomiting, or diarrhea.  You have a bad smelling vaginal discharge.  You have pain with urination. SEEK IMMEDIATE MEDICAL CARE IF:   You have a fever.  You are leaking fluid from your vagina.  You have spotting or bleeding from your vagina.  You have severe abdominal cramping or pain.  You have rapid weight gain or loss.  You have shortness of breath with chest pain.  You notice sudden or extreme swelling of your face, hands, ankles, feet, or legs.  You have not felt your baby move in over an hour.  You have severe headaches that do not go away with medicine.  You have vision changes. Document Released: 07/13/2001 Document Revised: 03/21/2013 Document Reviewed: 09/19/2012 ExitCare Patient Information 2014 [employer].  

## 2014-01-14 ENCOUNTER — Telehealth: Payer: Self-pay | Admitting: General Practice

## 2014-01-14 NOTE — Telephone Encounter (Signed)
Patient called and left message stating she has some questions about her pregnancy, please call back. Called patient and she states that she started having a watery d/c today but theres no real odor or itch it to.patient also denies large gush of fluid or continued leakage of large amounts of fluid. Told patient it's probably normal pregnancy d/c but we can check it at her next visit to make sure she doesn't have an infection and that if she feels a large gush of fluid or continued leakage to go to MAU for further evaluation. Patient verbalized understanding and had no further questions

## 2014-01-16 ENCOUNTER — Telehealth: Payer: Self-pay | Admitting: *Deleted

## 2014-01-16 DIAGNOSIS — O219 Vomiting of pregnancy, unspecified: Secondary | ICD-10-CM

## 2014-01-16 MED ORDER — ONDANSETRON 8 MG PO TBDP: 8.0000 mg | ORAL_TABLET | Freq: Three times a day (TID) | ORAL | Status: DC | PRN

## 2014-01-16 NOTE — Telephone Encounter (Addendum)
Per [name] may refill patient's zofran. Called patient and informed her of refill and constipation precautions. Patient verbalized understanding and had no questions

## 2014-01-16 NOTE — Telephone Encounter (Signed)
Pt left message requesting refill of Zofran.  She is very nauseous and sick. Her prescription given on 6/3 of #20 tabs is gone.

## 2014-01-23 ENCOUNTER — Ambulatory Visit (INDEPENDENT_AMBULATORY_CARE_PROVIDER_SITE_OTHER): Payer: Medicaid Other | Admitting: Advanced Practice Midwife

## 2014-01-23 VITALS — BP 88/58 | HR 98 | Temp 98.2°F | Wt 105.7 lb

## 2014-01-23 DIAGNOSIS — Z3480 Encounter for supervision of other normal pregnancy, unspecified trimester: Secondary | ICD-10-CM

## 2014-01-23 DIAGNOSIS — Z3493 Encounter for supervision of normal pregnancy, unspecified, third trimester: Secondary | ICD-10-CM

## 2014-01-23 LAB — POCT URINALYSIS DIP (DEVICE)
Bilirubin Urine: NEGATIVE
Glucose, UA: NEGATIVE mg/dL
NITRITE: NEGATIVE
PROTEIN: 30 mg/dL — AB
Specific Gravity, Urine: 1.02 (ref 1.005–1.030)
UROBILINOGEN UA: 0.2 mg/dL (ref 0.0–1.0)
pH: 7 (ref 5.0–8.0)

## 2014-01-23 LAB — CBC
HCT: 29.8 % — ABNORMAL LOW (ref 36.0–46.0)
HEMOGLOBIN: 10.2 g/dL — AB (ref 12.0–15.0)
MCH: 27.1 pg (ref 26.0–34.0)
MCHC: 34.2 g/dL (ref 30.0–36.0)
MCV: 79 fL (ref 78.0–100.0)
PLATELETS: 255 10*3/uL (ref 150–400)
RBC: 3.77 MIL/uL — AB (ref 3.87–5.11)
RDW: 13.6 % (ref 11.5–15.5)
WBC: 10.8 10*3/uL — AB (ref 4.0–10.5)

## 2014-01-23 MED ORDER — ENSURE PLUS PO LIQD: ORAL | Status: DC

## 2014-01-23 NOTE — Patient Instructions (Signed)
Nausea medication to take during pregnancy:  ° °Unisom (doxylamine succinate 25 mg tablets) Take one tablet daily at bedtime. If symptoms are not adequately controlled, the dose can be increased to a maximum recommended dose of two tablets daily (1/2 tablet in the morning, 1/2 tablet mid-afternoon and one at bedtime). ° °Vitamin B6 100mg tablets. Take one tablet twice a day (up to 200 mg per day). ° °Add Zofran as prescribed to take as needed.  ° ° ° °

## 2014-01-23 NOTE — Progress Notes (Signed)
Doing well.  Good fetal movement, denies regular contractions but does have watery discharge and irregular cramping described as "baby balling up" a few times/day.  She did see pink discharge when wiping yesterday but none today.  She reports she still has daily nausea and is concerned about her weight gain.  Pelvic exam: Cervix pink, visually closed and long, without lesion, large amount thin clear discharge, some pink on cotton swab during exam, vaginal walls and external genitalia normal.  Ferning slide made--negative.  Wet prep pending. [facility]  Pt given instructions on how to take Unisom/B6.

## 2014-01-23 NOTE — Progress Notes (Signed)
Reports intermittent pelvic pressure and braxton hicks.  Reports a lot of white discharge-- denies itching but states it is irritating. Reports noticing pink spotting on tissue when wiped yesterday.  1hr gtt and labs today.

## 2014-01-24 LAB — GLUCOSE TOLERANCE, 1 HOUR (50G) W/O FASTING: GLUCOSE 1 HOUR GTT: 96 mg/dL (ref 70–140)

## 2014-01-24 LAB — RPR

## 2014-01-24 LAB — WET PREP, GENITAL
TRICH WET PREP: NONE SEEN
Yeast Wet Prep HPF POC: NONE SEEN

## 2014-01-24 LAB — HIV ANTIBODY (ROUTINE TESTING W REFLEX): HIV 1&2 Ab, 4th Generation: NONREACTIVE

## 2014-01-25 ENCOUNTER — Other Ambulatory Visit: Payer: Self-pay | Admitting: Advanced Practice Midwife

## 2014-01-25 LAB — CULTURE, OB URINE

## 2014-01-25 MED ORDER — METRONIDAZOLE 500 MG PO TABS: 500.0000 mg | ORAL_TABLET | Freq: Two times a day (BID) | ORAL | Status: DC

## 2014-01-31 ENCOUNTER — Telehealth: Payer: Self-pay | Admitting: General Practice

## 2014-01-31 DIAGNOSIS — O219 Vomiting of pregnancy, unspecified: Secondary | ICD-10-CM

## 2014-01-31 MED ORDER — ONDANSETRON 8 MG PO TBDP: 8.0000 mg | ORAL_TABLET | Freq: Three times a day (TID) | ORAL | Status: DC | PRN

## 2014-01-31 NOTE — Telephone Encounter (Signed)
Patient called back leaving message she missed our call please call back. Called patient back and asked her what nausea medication she has been taking and she stated zofran.  Received order from Dr Erin FullingHarraway Smith to refill zofran #20 with no refills. Informed patient of refill and to discuss with provider at next visit that she is still needing a lot of nausea medication. Patient verbalized understanding and had no other questions

## 2014-01-31 NOTE — Telephone Encounter (Signed)
Patient called and left message stating she is one of our hyperemesis patients and she has ran out of all her nausea medication and really needs something refilled as soon as possible. Called patient, no answer- unable to leave message due to voicemail box full. 

## 2014-01-31 NOTE — Telephone Encounter (Signed)
Patient called and left message stating she is one of our hyperemesis patients and she has ran out of all her nausea medication and really needs something refilled as soon as possible. Called patient, no answer- unable to leave message due to voicemail box full.

## 2014-02-08 ENCOUNTER — Ambulatory Visit (INDEPENDENT_AMBULATORY_CARE_PROVIDER_SITE_OTHER): Payer: Medicaid Other | Admitting: Obstetrics and Gynecology

## 2014-02-08 VITALS — BP 96/66 | HR 103 | Temp 99.8°F | Wt 102.9 lb

## 2014-02-08 DIAGNOSIS — O365930 Maternal care for other known or suspected poor fetal growth, third trimester, not applicable or unspecified: Secondary | ICD-10-CM | POA: Insufficient documentation

## 2014-02-08 DIAGNOSIS — O219 Vomiting of pregnancy, unspecified: Secondary | ICD-10-CM

## 2014-02-08 LAB — POCT URINALYSIS DIP (DEVICE)
Glucose, UA: NEGATIVE mg/dL
Nitrite: NEGATIVE
PROTEIN: 100 mg/dL — AB
UROBILINOGEN UA: 0.2 mg/dL (ref 0.0–1.0)
pH: 6 (ref 5.0–8.0)

## 2014-02-08 MED ORDER — METOCLOPRAMIDE HCL 10 MG PO TABS: 10.0000 mg | ORAL_TABLET | Freq: Four times a day (QID) | ORAL | Status: DC

## 2014-02-08 MED ORDER — ONDANSETRON 8 MG PO TBDP: 8.0000 mg | ORAL_TABLET | Freq: Three times a day (TID) | ORAL | Status: DC | PRN

## 2014-02-08 NOTE — Progress Notes (Signed)
Reports braxton hicks.   

## 2014-02-08 NOTE — Progress Notes (Signed)
Dehydrated today.

## 2014-02-08 NOTE — Patient Instructions (Signed)
Hyperemesis Gravidarum °Hyperemesis gravidarum is a severe form of nausea and vomiting that happens during pregnancy. Hyperemesis is worse than morning sickness. It may cause you to have nausea or vomiting all day for many days. It may keep you from eating and drinking enough food and liquids. Hyperemesis usually occurs during the first half (the first 20 weeks) of pregnancy. It often goes away once a woman is in her second half of pregnancy. However, sometimes hyperemesis continues through an entire pregnancy.  °CAUSES  °The cause of this condition is not completely known but is thought to be related to changes in the body's hormones when pregnant. It could be from the high level of the pregnancy hormone or an increase in estrogen in the body.  °SIGNS AND SYMPTOMS  °· Severe nausea and vomiting. °· Nausea that does not go away. °· Vomiting that does not allow you to keep any food down. °· Weight loss and body fluid loss (dehydration). °· Having no desire to eat or not liking food you have previously enjoyed. °DIAGNOSIS  °Your health care provider will do a physical exam and ask you about your symptoms. He or she may also order blood tests and urine tests to make sure something else is not causing the problem.  °TREATMENT  °You may only need medicine to control the problem. If medicines do not control the nausea and vomiting, you will be treated in the hospital to prevent dehydration, increased acid in the blood (acidosis), weight loss, and changes in the electrolytes in your body that may harm the unborn baby (fetus). You may need IV fluids.  °HOME CARE INSTRUCTIONS  °· Only take over-the-counter or prescription medicines as directed by your health care provider. °· Try eating a couple of dry crackers or toast in the morning before getting out of bed. °· Avoid foods and smells that upset your stomach. °· Avoid fatty and spicy foods. °· Eat 5-6 small meals a day. °· Do not drink when eating meals. Drink between  meals. °· For snacks, eat high-protein foods, such as cheese. °· Eat or suck on things that have ginger in them. Ginger helps nausea. °· Avoid food preparation. The smell of food can spoil your appetite. °· Avoid iron pills and iron in your multivitamins until after 3-4 months of being pregnant. However, consult with your health care provider before stopping any prescribed iron pills. °SEEK MEDICAL CARE IF:  °· Your abdominal pain increases. °· You have a severe headache. °· You have vision problems. °· You are losing weight. °SEEK IMMEDIATE MEDICAL CARE IF:  °· You are unable to keep fluids down. °· You vomit blood. °· You have constant nausea and vomiting. °· You have excessive weakness. °· You have extreme thirst. °· You have dizziness or fainting. °· You have a fever or persistent symptoms for more than 2-3 days. °· You have a fever and your symptoms suddenly get worse. °MAKE SURE YOU:  °· Understand these instructions. °· Will watch your condition. °· Will get help right away if you are not doing well or get worse. °Document Released: 07/19/2005 Document Revised: 05/09/2013 Document Reviewed: 02/28/2013 °ExitCare® Patient Information ©2015 [employer]. This information is not intended to replace advice given to you by your health care provider. Make sure you discuss any questions you have with your health care provider. ° °

## 2014-02-13 ENCOUNTER — Ambulatory Visit (HOSPITAL_COMMUNITY)
Admission: RE | Admit: 2014-02-13 | Discharge: 2014-02-13 | Disposition: A | Discharge status: Home / Self Care | Payer: Medicaid Other | Source: Ambulatory Visit | Attending: Obstetrics and Gynecology | Admitting: Obstetrics and Gynecology

## 2014-02-13 DIAGNOSIS — O365990 Maternal care for other known or suspected poor fetal growth, unspecified trimester, not applicable or unspecified: Secondary | ICD-10-CM | POA: Insufficient documentation

## 2014-02-13 DIAGNOSIS — Z3689 Encounter for other specified antenatal screening: Secondary | ICD-10-CM | POA: Diagnosis not present

## 2014-02-13 DIAGNOSIS — O365931 Maternal care for other known or suspected poor fetal growth, third trimester, fetus 1: Secondary | ICD-10-CM

## 2014-02-13 DIAGNOSIS — O219 Vomiting of pregnancy, unspecified: Secondary | ICD-10-CM

## 2014-02-24 ENCOUNTER — Inpatient Hospital Stay (HOSPITAL_COMMUNITY)
Admission: AD | Admit: 2014-02-24 | Discharge: 2014-02-24 | Disposition: A | Discharge status: Home / Self Care | Payer: Medicaid Other | Source: Ambulatory Visit | Attending: Obstetrics & Gynecology | Admitting: Obstetrics & Gynecology

## 2014-02-24 ENCOUNTER — Encounter (HOSPITAL_COMMUNITY): Payer: Self-pay

## 2014-02-24 ENCOUNTER — Inpatient Hospital Stay (HOSPITAL_COMMUNITY): Payer: Medicaid Other

## 2014-02-24 DIAGNOSIS — O99330 Smoking (tobacco) complicating pregnancy, unspecified trimester: Secondary | ICD-10-CM | POA: Diagnosis not present

## 2014-02-24 DIAGNOSIS — O4703 False labor before 37 completed weeks of gestation, third trimester: Secondary | ICD-10-CM

## 2014-02-24 DIAGNOSIS — M4120 Other idiopathic scoliosis, site unspecified: Secondary | ICD-10-CM | POA: Diagnosis not present

## 2014-02-24 DIAGNOSIS — O4700 False labor before 37 completed weeks of gestation, unspecified trimester: Secondary | ICD-10-CM | POA: Insufficient documentation

## 2014-02-24 DIAGNOSIS — O4690 Antepartum hemorrhage, unspecified, unspecified trimester: Secondary | ICD-10-CM | POA: Insufficient documentation

## 2014-02-24 LAB — WET PREP, GENITAL
CLUE CELLS WET PREP: NONE SEEN
Trich, Wet Prep: NONE SEEN
Yeast Wet Prep HPF POC: NONE SEEN

## 2014-02-24 LAB — URINE MICROSCOPIC-ADD ON

## 2014-02-24 LAB — URINALYSIS, ROUTINE W REFLEX MICROSCOPIC
GLUCOSE, UA: NEGATIVE mg/dL
Leukocytes, UA: NEGATIVE
Nitrite: NEGATIVE
PH: 7 (ref 5.0–8.0)
Protein, ur: NEGATIVE mg/dL
Specific Gravity, Urine: 1.02 (ref 1.005–1.030)
Urobilinogen, UA: 1 mg/dL (ref 0.0–1.0)

## 2014-02-24 LAB — GROUP B STREP BY PCR: GROUP B STREP BY PCR: NEGATIVE

## 2014-02-24 MED ORDER — LACTATED RINGERS IV BOLUS (SEPSIS)
1000.0000 mL | Freq: Once | INTRAVENOUS | Status: AC
  Administered 2014-02-24: 1000 mL via INTRAVENOUS

## 2014-02-24 MED ORDER — BETAMETHASONE SOD PHOS & ACET 6 (3-3) MG/ML IJ SUSP
12.0000 mg | Freq: Once | INTRAMUSCULAR | Status: AC
  Administered 2014-02-24: 12 mg via INTRAMUSCULAR
  Filled 2014-02-24: qty 2

## 2014-02-24 MED ORDER — ONDANSETRON 8 MG/NS 50 ML IVPB
8.0000 mg | Freq: Once | INTRAVENOUS | Status: AC
  Administered 2014-02-24: 8 mg via INTRAVENOUS
  Filled 2014-02-24: qty 8

## 2014-02-24 NOTE — MAU Provider Note (Signed)
History     CSN: [id number]  Arrival date and time: 02/24/14 0950   None     Chief Complaint  Patient presents with  . Emesis  . Vaginal Bleeding  . Abdominal Pain   HPI  Pt is a 33 yo G5P2022 at [redacted]w[redacted]d wks IUP here with report of  intermittent dark brown/bright red bleeding for the past 2 days. Hx hyperemesis. Reports vomiting x 1 in past 24 hours. Four loose stools in past 24 hours.  Reports lower pelvic cramping, that is intermittent in nature, occuring every 10 minutes.  Last intercourse reported in May 2015.    Past Medical History  Diagnosis Date  . Scoliosis   . Asthma   . Abnormal Pap smear 2011    repeat WNL; cryo 20011    Past Surgical History  Procedure Laterality Date  . Hand surgery Left   . Dilation and curettage of uterus      Family History  Problem Relation Age of Onset  . Anesthesia problems Neg Hx   . Diabetes Mother   . Sickle cell anemia Mother     History  Substance Use Topics  . Smoking status: Current Every Day Smoker -- 0.35 packs/day    Types: Cigarettes  . Smokeless tobacco: Never Used  . Alcohol Use: No     Comment: occasionally    Allergies:  Allergies  Allergen Reactions  . Promethazine Hcl Palpitations    Makes her jittery    Prescriptions prior to admission  Medication Sig Dispense Refill  . ENSURE PLUS (ENSURE PLUS) LIQD Drink 1 container ( ) TID between meals.  946 mL  10  . metoCLOPramide (REGLAN) 10 MG tablet Take 1 tablet (10 mg total) by mouth 4 (four) times daily.  120 tablet  1  . ondansetron (ZOFRAN ODT) 8 MG disintegrating tablet Take 1 tablet (8 mg total) by mouth every 8 (eight) hours as needed for nausea or vomiting.  20 tablet  0    Review of Systems  Constitutional: Negative for fever and chills.  Gastrointestinal: Positive for nausea, vomiting, abdominal pain (intermittent) and diarrhea.  Genitourinary:       Vaginal bleeding   Physical Exam   Blood pressure 90/58, pulse 80, temperature 97.9 F  (36.6 C), temperature source Oral, resp. rate 18, height 4\' 11"  (1.499 m), weight 47.628 kg (105 lb), last menstrual period 07/12/2013.  Physical Exam  Constitutional: She is oriented to person, place, and time. She appears well-developed and well-nourished. No distress.  HENT:  Head: Normocephalic.  Neck: Normal range of motion. Neck supple.  Cardiovascular: Normal rate, regular rhythm and normal heart sounds.   Respiratory: Effort normal and breath sounds normal.  GI: Soft. There is no tenderness.  Genitourinary: No bleeding around the vagina. Vaginal discharge (dark brown discharge) found.  Musculoskeletal: Normal range of motion. She exhibits no edema.  Neurological: She is alert and oriented to person, place, and time.  Skin: Skin is warm and dry.   Dilation: 1 Effacement (%): 50 Cervical Position: Posterior Station: Ballotable Exam by:: Margarita Mail, CNM  Ultrasound: AFI 13.96 Placenta - nothing abnormal noted  Cervical recheck > no change  FHR 120's, +accels Toco - q 2 min > after LR, contractions irregular  MAU Course  Procedures 2 L LR BMZ 12 mg IM  Assessment and Plan  33 yo [id number] at [redacted]w[redacted]d wks IUP Preterm Contractions Vaginal Bleeding in Pregnancy  Plan: Discharge to home Repeat BMZ in 24 hours Bleeding/preterm labor precautions Keep  scheduled appointment  Rochele PagesKARIM, Joevon Holliman N 02/24/2014, 10:55 AM

## 2014-02-24 NOTE — MAU Note (Signed)
Pt states has had intermittent dark brown/bright red bleeding for the past 2 days. Hx hyperemesis. Also has loose stools x1 day and n/v. No recent intercourse

## 2014-02-24 NOTE — Discharge Instructions (Signed)
Pelvic Rest °Pelvic rest is sometimes recommended for women when:  °· The placenta is partially or completely covering the opening of the cervix (placenta previa). °· There is bleeding between the uterine wall and the amniotic sac in the first trimester (subchorionic hemorrhage). °· The cervix begins to open without labor starting (incompetent cervix, cervical insufficiency). °· The labor is too early (preterm labor). °HOME CARE INSTRUCTIONS °· Do not have sexual intercourse, stimulation, or an orgasm. °· Do not use tampons, douche, or put anything in the vagina. °· Do not lift anything over 10 pounds (4.5 kg). °· Avoid strenuous activity or straining your pelvic muscles. °SEEK MEDICAL CARE IF:  °· You have any vaginal bleeding during pregnancy. Treat this as a potential emergency. °· You have cramping pain felt low in the stomach (stronger than menstrual cramps). °· You notice vaginal discharge (watery, mucus, or bloody). °· You have a low, dull backache. °· There are regular contractions or uterine tightening. °SEEK IMMEDIATE MEDICAL CARE IF: °You have vaginal bleeding and have placenta previa.  °Document Released: 11/13/2010 Document Revised: 10/11/2011 Document Reviewed: 11/13/2010 °ExitCare® Patient Information ©2015 [employer]. This information is not intended to replace advice given to you by your health care provider. Make sure you discuss any questions you have with your health care provider. ° °Preterm Labor Information °Preterm labor is when labor starts at less than 37 weeks of pregnancy. The normal length of a pregnancy is 39 to 41 weeks. °CAUSES °Often, there is no identifiable underlying cause as to why a woman goes into preterm labor. One of the most common known causes of preterm labor is infection. Infections of the uterus, cervix, vagina, amniotic sac, bladder, kidney, or even the lungs (pneumonia) can cause labor to start. Other suspected causes of preterm labor include:  °· Urogenital  infections, such as yeast infections and bacterial vaginosis.   °· Uterine abnormalities (uterine shape, uterine septum, fibroids, or bleeding from the placenta).   °· A cervix that has been operated on (it may fail to stay closed).   °· Malformations in the fetus.   °· Multiple gestations (twins, triplets, and so on).   °· Breakage of the amniotic sac.   °RISK FACTORS °· Having a previous history of preterm labor.   °· Having premature rupture of membranes (PROM).   °· Having a placenta that covers the opening of the cervix (placenta previa).   °· Having a placenta that separates from the uterus (placental abruption).   °· Having a cervix that is too weak to hold the fetus in the uterus (incompetent cervix).   °· Having too much fluid in the amniotic sac (polyhydramnios).   °· Taking illegal drugs or smoking while pregnant.   °· Not gaining enough weight while pregnant.   °· Being younger than 18 and older than 32 years old.   °· Having a low socioeconomic status.   °· Being African American. °SYMPTOMS °Signs and symptoms of preterm labor include:  °· Menstrual-like cramps, abdominal pain, or back pain. °· Uterine contractions that are regular, as frequent as six in an hour, regardless of their intensity (may be mild or painful). °· Contractions that start on the top of the uterus and spread down to the lower abdomen and back.   °· A sense of increased pelvic pressure.   °· A watery or bloody mucus discharge that comes from the vagina.   °TREATMENT °Depending on the length of the pregnancy and other circumstances, your health care provider may suggest bed rest. If necessary, there are medicines that can be given to stop contractions and to mature the   fetal lungs. If labor happens before 34 weeks of pregnancy, a prolonged hospital stay may be recommended. Treatment depends on the condition of both you and the fetus.  °WHAT SHOULD YOU DO IF YOU THINK YOU ARE IN PRETERM LABOR? °Call your health care provider right  away. You will need to go to the hospital to get checked immediately. °HOW CAN YOU PREVENT PRETERM LABOR IN FUTURE PREGNANCIES? °You should:  °· Stop smoking if you smoke.  °· Maintain healthy weight gain and avoid chemicals and drugs that are not necessary. °· Be watchful for any type of infection. °· Inform your health care provider if you have a known history of preterm labor. °Document Released: 10/09/2003 Document Revised: 03/21/2013 Document Reviewed: 08/21/2012 °ExitCare® Patient Information ©2015 [employer]. This information is not intended to replace advice given to you by your health care provider. Make sure you discuss any questions you have with your health care provider. ° °

## 2014-02-25 ENCOUNTER — Telehealth: Payer: Self-pay | Admitting: General Practice

## 2014-02-25 ENCOUNTER — Inpatient Hospital Stay (HOSPITAL_COMMUNITY)
Admission: AD | Admit: 2014-02-25 | Discharge: 2014-02-25 | Disposition: A | Discharge status: Home / Self Care | Payer: Medicaid Other | Source: Ambulatory Visit | Attending: Obstetrics & Gynecology | Admitting: Obstetrics & Gynecology

## 2014-02-25 DIAGNOSIS — O4700 False labor before 37 completed weeks of gestation, unspecified trimester: Secondary | ICD-10-CM | POA: Diagnosis present

## 2014-02-25 DIAGNOSIS — O219 Vomiting of pregnancy, unspecified: Secondary | ICD-10-CM

## 2014-02-25 LAB — GC/CHLAMYDIA PROBE AMP
CT Probe RNA: NEGATIVE
GC Probe RNA: NEGATIVE

## 2014-02-25 MED ORDER — BETAMETHASONE SOD PHOS & ACET 6 (3-3) MG/ML IJ SUSP
12.0000 mg | Freq: Once | INTRAMUSCULAR | Status: AC
  Administered 2014-02-25: 12 mg via INTRAMUSCULAR
  Filled 2014-02-25: qty 2

## 2014-02-25 MED ORDER — ONDANSETRON 8 MG PO TBDP: 8.0000 mg | ORAL_TABLET | Freq: Three times a day (TID) | ORAL | Status: DC | PRN

## 2014-02-25 NOTE — MAU Note (Signed)
Here for 2nd dose of betamethasone.  No problems after injection yesterday.  No complaints.

## 2014-02-25 NOTE — Telephone Encounter (Signed)
Patient called and left message stating she was seen in the ER yesterday for dehydration and other problems and she asked Walidah for a refill on her zofran but she's not sure if she sent it to her pharmacy or not or if she forgot. Called patient and she states that she has not received the medication and is out of the supply she was given earlier this month. Obtained refill by Dr Erin FullingHarraway Smith. Informed patient of refill and that she may go by her pharmacy. Patient verbalized understanding and had no other questions

## 2014-02-26 ENCOUNTER — Ambulatory Visit (INDEPENDENT_AMBULATORY_CARE_PROVIDER_SITE_OTHER): Payer: Medicaid Other | Admitting: Obstetrics and Gynecology

## 2014-02-26 VITALS — BP 106/58 | HR 107 | Temp 98.2°F | Wt 109.1 lb

## 2014-02-26 DIAGNOSIS — O4693 Antepartum hemorrhage, unspecified, third trimester: Secondary | ICD-10-CM | POA: Insufficient documentation

## 2014-02-26 DIAGNOSIS — O4690 Antepartum hemorrhage, unspecified, unspecified trimester: Secondary | ICD-10-CM

## 2014-02-26 DIAGNOSIS — O365931 Maternal care for other known or suspected poor fetal growth, third trimester, fetus 1: Secondary | ICD-10-CM

## 2014-02-26 DIAGNOSIS — O3090 Multiple gestation, unspecified, unspecified trimester: Secondary | ICD-10-CM

## 2014-02-26 DIAGNOSIS — O365990 Maternal care for other known or suspected poor fetal growth, unspecified trimester, not applicable or unspecified: Secondary | ICD-10-CM

## 2014-02-26 LAB — POCT URINALYSIS DIP (DEVICE)
Bilirubin Urine: NEGATIVE
GLUCOSE, UA: NEGATIVE mg/dL
Ketones, ur: NEGATIVE mg/dL
NITRITE: NEGATIVE
PH: 7 (ref 5.0–8.0)
Protein, ur: NEGATIVE mg/dL
Specific Gravity, Urine: 1.015 (ref 1.005–1.030)
Urobilinogen, UA: 0.2 mg/dL (ref 0.0–1.0)

## 2014-02-26 NOTE — Progress Notes (Signed)
Seen in MAU 2 days ago for VB and PT UCs> BMZ x2 given. Cx was 1/50/-2. US> no evidence abruption. N/V improved. Gaining weight. No ketonuria.  Denies UCs. White discharge, no blood seen. Cautioned pelvic rest, bleeding and PTL precautions.

## 2014-02-26 NOTE — Progress Notes (Signed)
Pt went MAU on 02/26/14 for vaginal bleeding and cramping; pt was checked for dilation

## 2014-03-07 ENCOUNTER — Ambulatory Visit (INDEPENDENT_AMBULATORY_CARE_PROVIDER_SITE_OTHER): Payer: Medicaid Other | Admitting: Obstetrics & Gynecology

## 2014-03-07 VITALS — BP 94/62 | HR 90 | Wt 105.4 lb

## 2014-03-07 DIAGNOSIS — O2613 Low weight gain in pregnancy, third trimester: Secondary | ICD-10-CM

## 2014-03-07 DIAGNOSIS — O9989 Other specified diseases and conditions complicating pregnancy, childbirth and the puerperium: Secondary | ICD-10-CM

## 2014-03-07 LAB — POCT URINALYSIS DIP (DEVICE)
BILIRUBIN URINE: NEGATIVE
Glucose, UA: NEGATIVE mg/dL
Hgb urine dipstick: NEGATIVE
Ketones, ur: NEGATIVE mg/dL
Nitrite: NEGATIVE
PH: 6.5 (ref 5.0–8.0)
PROTEIN: 30 mg/dL — AB
Specific Gravity, Urine: 1.015 (ref 1.005–1.030)
Urobilinogen, UA: 0.2 mg/dL (ref 0.0–1.0)

## 2014-03-07 LAB — TSH: TSH: 1.059 u[IU]/mL (ref 0.350–4.500)

## 2014-03-07 NOTE — Progress Notes (Signed)
Pt scheduled for follow up u/s 03/11/14 @ 11:15.

## 2014-03-07 NOTE — Progress Notes (Signed)
Pt has s<d and poor weight.  Taking ensure (up to 6 cans per day).  Has met with nutrition before and does not want to meet again.  Ultrasound ordered for growth.  Check TSH for weight loss.

## 2014-03-08 ENCOUNTER — Encounter: Payer: Self-pay | Admitting: Obstetrics & Gynecology

## 2014-03-08 LAB — DRUG SCREEN, URINE
Amphetamine Screen, Ur: NEGATIVE
BARBITURATE QUANT UR: NEGATIVE
Benzodiazepines.: NEGATIVE
Cocaine Metabolites: NEGATIVE
Creatinine,U: 220.3 mg/dL
Marijuana Metabolite: POSITIVE — AB
Methadone: NEGATIVE
OPIATES: NEGATIVE
PROPOXYPHENE: NEGATIVE
Phencyclidine (PCP): NEGATIVE

## 2014-03-11 ENCOUNTER — Other Ambulatory Visit: Payer: Self-pay | Admitting: Obstetrics & Gynecology

## 2014-03-11 ENCOUNTER — Ambulatory Visit (HOSPITAL_COMMUNITY)
Admission: RE | Admit: 2014-03-11 | Discharge: 2014-03-11 | Disposition: A | Discharge status: Home / Self Care | Payer: Medicaid Other | Source: Ambulatory Visit | Attending: Obstetrics & Gynecology | Admitting: Obstetrics & Gynecology

## 2014-03-11 DIAGNOSIS — O9989 Other specified diseases and conditions complicating pregnancy, childbirth and the puerperium: Secondary | ICD-10-CM | POA: Diagnosis present

## 2014-03-11 DIAGNOSIS — Z3689 Encounter for other specified antenatal screening: Secondary | ICD-10-CM | POA: Diagnosis not present

## 2014-03-11 DIAGNOSIS — O2613 Low weight gain in pregnancy, third trimester: Secondary | ICD-10-CM

## 2014-03-12 ENCOUNTER — Telehealth: Payer: Self-pay | Admitting: *Deleted

## 2014-03-12 ENCOUNTER — Encounter: Payer: Self-pay | Admitting: Obstetrics and Gynecology

## 2014-03-12 ENCOUNTER — Ambulatory Visit (INDEPENDENT_AMBULATORY_CARE_PROVIDER_SITE_OTHER): Payer: Medicaid Other | Admitting: Obstetrics and Gynecology

## 2014-03-12 VITALS — BP 101/51 | HR 78 | Temp 98.4°F | Wt 107.4 lb

## 2014-03-12 DIAGNOSIS — M47817 Spondylosis without myelopathy or radiculopathy, lumbosacral region: Secondary | ICD-10-CM

## 2014-03-12 DIAGNOSIS — O4693 Antepartum hemorrhage, unspecified, third trimester: Secondary | ICD-10-CM

## 2014-03-12 DIAGNOSIS — Z3480 Encounter for supervision of other normal pregnancy, unspecified trimester: Secondary | ICD-10-CM

## 2014-03-12 DIAGNOSIS — D573 Sickle-cell trait: Secondary | ICD-10-CM

## 2014-03-12 DIAGNOSIS — M47816 Spondylosis without myelopathy or radiculopathy, lumbar region: Secondary | ICD-10-CM

## 2014-03-12 DIAGNOSIS — M5124 Other intervertebral disc displacement, thoracic region: Secondary | ICD-10-CM

## 2014-03-12 DIAGNOSIS — O4690 Antepartum hemorrhage, unspecified, unspecified trimester: Secondary | ICD-10-CM

## 2014-03-12 DIAGNOSIS — O210 Mild hyperemesis gravidarum: Secondary | ICD-10-CM

## 2014-03-12 DIAGNOSIS — M5134 Other intervertebral disc degeneration, thoracic region: Secondary | ICD-10-CM

## 2014-03-12 DIAGNOSIS — F1210 Cannabis abuse, uncomplicated: Secondary | ICD-10-CM

## 2014-03-12 DIAGNOSIS — O365990 Maternal care for other known or suspected poor fetal growth, unspecified trimester, not applicable or unspecified: Secondary | ICD-10-CM

## 2014-03-12 DIAGNOSIS — Z3483 Encounter for supervision of other normal pregnancy, third trimester: Secondary | ICD-10-CM

## 2014-03-12 DIAGNOSIS — O365930 Maternal care for other known or suspected poor fetal growth, third trimester, not applicable or unspecified: Secondary | ICD-10-CM

## 2014-03-12 LAB — POCT URINALYSIS DIP (DEVICE)
Glucose, UA: NEGATIVE mg/dL
Nitrite: NEGATIVE
PROTEIN: 30 mg/dL — AB
Specific Gravity, Urine: 1.02 (ref 1.005–1.030)
UROBILINOGEN UA: 1 mg/dL (ref 0.0–1.0)
pH: 6 (ref 5.0–8.0)

## 2014-03-12 NOTE — Progress Notes (Signed)
C/o of intermittent pelvic pain and "baby balling up."

## 2014-03-12 NOTE — Telephone Encounter (Signed)
Patient called the clinic concerning over poor growth of baby per her ultrasound results and constant nausea.  Pt state she continues to lose weight and needs to change her medication.  Appointment made for today @ 2:00 to see provider.Pt verbalizes understanding.

## 2014-03-12 NOTE — Telephone Encounter (Addendum)
Pt left message @ [phone] stating that she is out of her medicine for hyperemesis. She has been really sick and feels horrible. Please call back.  Pt spoke with Candace earlier regarding her concerns and was given appt to be seen in clinic today @ 1400.

## 2014-03-12 NOTE — Progress Notes (Signed)
Patient is c/o persistent nausea and emesis. Rx zofran provided. Will start twice weekly NST with weekly AFI/dopplers today. FM/PTL precautions reviewed

## 2014-03-13 ENCOUNTER — Other Ambulatory Visit: Payer: Self-pay

## 2014-03-13 ENCOUNTER — Encounter: Payer: Medicaid Other | Admitting: Advanced Practice Midwife

## 2014-03-13 ENCOUNTER — Encounter: Payer: Self-pay | Admitting: Obstetrics & Gynecology

## 2014-03-13 DIAGNOSIS — O219 Vomiting of pregnancy, unspecified: Secondary | ICD-10-CM

## 2014-03-13 MED ORDER — ONDANSETRON 8 MG PO TBDP: 8.0000 mg | ORAL_TABLET | Freq: Three times a day (TID) | ORAL | Status: DC | PRN

## 2014-03-13 NOTE — Progress Notes (Unsigned)
Patient called stating RX for Zofran was supposed to be sent to pharmacy yesterday but was not. Per chart review, RX had not been ordered but per Dr. Deretha Emoryonstant's notes she meant to. Rx for zofran reodered and e-prescribed to patient's pharmacy.  Patient informed. No further questions or concerns.

## 2014-03-14 ENCOUNTER — Ambulatory Visit (INDEPENDENT_AMBULATORY_CARE_PROVIDER_SITE_OTHER): Payer: Medicaid Other | Admitting: *Deleted

## 2014-03-14 VITALS — BP 98/56 | HR 105

## 2014-03-14 DIAGNOSIS — O365990 Maternal care for other known or suspected poor fetal growth, unspecified trimester, not applicable or unspecified: Secondary | ICD-10-CM

## 2014-03-14 DIAGNOSIS — O365930 Maternal care for other known or suspected poor fetal growth, third trimester, not applicable or unspecified: Secondary | ICD-10-CM

## 2014-03-14 NOTE — Progress Notes (Signed)
NST performed today was reviewed and was found to be reactive.  Continue recommended antenatal testing and prenatal care.  

## 2014-03-18 ENCOUNTER — Telehealth: Payer: Self-pay | Admitting: General Practice

## 2014-03-18 NOTE — Telephone Encounter (Signed)
Patient called and left message stating she is about 36 weeks and is having contractions about 7-10 minutes apart and is also having some spotting, diarrhea, and nausea and would like call back. Called patient back and she states she has the contractions off and on throughout the day and doesn't know if this is normal or not and has also been having some diarrhea and nausea. Told patient to continue the nausea medications that were prescribed at her last visit and that she may take imodium for her diarrhea as needed as well. Discussed with patient that it can be normal to have some contractions at this point in her pregnancy but that if they were to become regular like 5 minutes apart for longer than an hour she should come into MAU or if she were to have bleeding like a period to come in as well. Patient verbalized understanding to all. Reminded patient of appt tomorrow and that we can certainly check her cervix then as well to see if maybe she is starting to dilate. Patient verbalized understanding and had no other questions

## 2014-03-19 ENCOUNTER — Ambulatory Visit (INDEPENDENT_AMBULATORY_CARE_PROVIDER_SITE_OTHER): Payer: Medicaid Other | Admitting: Obstetrics and Gynecology

## 2014-03-19 ENCOUNTER — Ambulatory Visit (HOSPITAL_COMMUNITY)
Admission: RE | Admit: 2014-03-19 | Discharge: 2014-03-19 | Disposition: A | Discharge status: Home / Self Care | Payer: Medicaid Other | Source: Ambulatory Visit | Attending: Obstetrics and Gynecology | Admitting: Obstetrics and Gynecology

## 2014-03-19 VITALS — BP 94/71 | HR 78 | Temp 98.2°F | Wt 109.4 lb

## 2014-03-19 DIAGNOSIS — O365930 Maternal care for other known or suspected poor fetal growth, third trimester, not applicable or unspecified: Secondary | ICD-10-CM

## 2014-03-19 DIAGNOSIS — O365990 Maternal care for other known or suspected poor fetal growth, unspecified trimester, not applicable or unspecified: Secondary | ICD-10-CM | POA: Insufficient documentation

## 2014-03-19 DIAGNOSIS — Z3689 Encounter for other specified antenatal screening: Secondary | ICD-10-CM | POA: Insufficient documentation

## 2014-03-19 DIAGNOSIS — Z3493 Encounter for supervision of normal pregnancy, unspecified, third trimester: Secondary | ICD-10-CM

## 2014-03-19 DIAGNOSIS — Z3480 Encounter for supervision of other normal pregnancy, unspecified trimester: Secondary | ICD-10-CM

## 2014-03-19 DIAGNOSIS — Z3483 Encounter for supervision of other normal pregnancy, third trimester: Secondary | ICD-10-CM

## 2014-03-19 LAB — POCT URINALYSIS DIP (DEVICE)
Bilirubin Urine: NEGATIVE
Glucose, UA: NEGATIVE mg/dL
Ketones, ur: NEGATIVE mg/dL
NITRITE: NEGATIVE
PH: 7 (ref 5.0–8.0)
Protein, ur: 30 mg/dL — AB
Specific Gravity, Urine: 1.015 (ref 1.005–1.030)
Urobilinogen, UA: 0.2 mg/dL (ref 0.0–1.0)

## 2014-03-19 NOTE — Progress Notes (Signed)
Patient is doing well without complaints other than 3rd trimester discomfort. Cultures collected today. Ultrasound done but report not available for review NST reviewed and reactive

## 2014-03-19 NOTE — Progress Notes (Signed)
US for AFI and cord doppler done today.

## 2014-03-19 NOTE — Progress Notes (Signed)
Pt complains of contractions, nausea/diarrhea.

## 2014-03-20 LAB — GC/CHLAMYDIA PROBE AMP
CT PROBE, AMP APTIMA: NEGATIVE
GC Probe RNA: NEGATIVE

## 2014-03-21 ENCOUNTER — Encounter: Payer: Self-pay | Admitting: Obstetrics and Gynecology

## 2014-03-21 ENCOUNTER — Inpatient Hospital Stay (HOSPITAL_COMMUNITY)
Admission: AD | Admit: 2014-03-21 | Discharge: 2014-03-23 | DRG: 775 | Disposition: A | Discharge status: Home / Self Care | Payer: Medicaid Other | Source: Ambulatory Visit | Attending: Family Medicine | Admitting: Family Medicine

## 2014-03-21 ENCOUNTER — Encounter (HOSPITAL_COMMUNITY): Payer: Self-pay | Admitting: *Deleted

## 2014-03-21 ENCOUNTER — Ambulatory Visit (INDEPENDENT_AMBULATORY_CARE_PROVIDER_SITE_OTHER): Payer: Medicaid Other | Admitting: *Deleted

## 2014-03-21 VITALS — Wt 108.3 lb

## 2014-03-21 DIAGNOSIS — O4700 False labor before 37 completed weeks of gestation, unspecified trimester: Secondary | ICD-10-CM | POA: Diagnosis present

## 2014-03-21 DIAGNOSIS — M4120 Other idiopathic scoliosis, site unspecified: Secondary | ICD-10-CM | POA: Diagnosis present

## 2014-03-21 DIAGNOSIS — F1920 Other psychoactive substance dependence, uncomplicated: Secondary | ICD-10-CM | POA: Diagnosis not present

## 2014-03-21 DIAGNOSIS — O99334 Smoking (tobacco) complicating childbirth: Principal | ICD-10-CM | POA: Diagnosis present

## 2014-03-21 DIAGNOSIS — O99892 Other specified diseases and conditions complicating childbirth: Secondary | ICD-10-CM | POA: Diagnosis present

## 2014-03-21 DIAGNOSIS — O3090 Multiple gestation, unspecified, unspecified trimester: Secondary | ICD-10-CM

## 2014-03-21 DIAGNOSIS — O99324 Drug use complicating childbirth: Secondary | ICD-10-CM

## 2014-03-21 DIAGNOSIS — O365990 Maternal care for other known or suspected poor fetal growth, unspecified trimester, not applicable or unspecified: Secondary | ICD-10-CM

## 2014-03-21 DIAGNOSIS — F1210 Cannabis abuse, uncomplicated: Secondary | ICD-10-CM

## 2014-03-21 DIAGNOSIS — J45909 Unspecified asthma, uncomplicated: Secondary | ICD-10-CM | POA: Diagnosis present

## 2014-03-21 DIAGNOSIS — O9989 Other specified diseases and conditions complicating pregnancy, childbirth and the puerperium: Secondary | ICD-10-CM

## 2014-03-21 DIAGNOSIS — Z833 Family history of diabetes mellitus: Secondary | ICD-10-CM

## 2014-03-21 DIAGNOSIS — O365931 Maternal care for other known or suspected poor fetal growth, third trimester, fetus 1: Secondary | ICD-10-CM

## 2014-03-21 LAB — CBC
HCT: 31.6 % — ABNORMAL LOW (ref 36.0–46.0)
Hemoglobin: 11.2 g/dL — ABNORMAL LOW (ref 12.0–15.0)
MCH: 28.2 pg (ref 26.0–34.0)
MCHC: 35.4 g/dL (ref 30.0–36.0)
MCV: 79.6 fL (ref 78.0–100.0)
PLATELETS: 290 10*3/uL (ref 150–400)
RBC: 3.97 MIL/uL (ref 3.87–5.11)
RDW: 13.1 % (ref 11.5–15.5)
WBC: 14.1 10*3/uL — ABNORMAL HIGH (ref 4.0–10.5)

## 2014-03-21 LAB — RPR

## 2014-03-21 MED ORDER — ZOLPIDEM TARTRATE 5 MG PO TABS: 5.0000 mg | ORAL_TABLET | Freq: Every evening | ORAL | Status: DC | PRN

## 2014-03-21 MED ORDER — OXYTOCIN 40 UNITS IN LACTATED RINGERS INFUSION - SIMPLE MED
62.5000 mL/h | INTRAVENOUS | Status: DC
  Filled 2014-03-21: qty 1000

## 2014-03-21 MED ORDER — FENTANYL CITRATE 0.05 MG/ML IJ SOLN
100.0000 ug | INTRAMUSCULAR | Status: DC | PRN
  Administered 2014-03-21: 100 ug via INTRAVENOUS
  Filled 2014-03-21: qty 2

## 2014-03-21 MED ORDER — DIBUCAINE 1 % RE OINT: 1.0000 "application " | TOPICAL_OINTMENT | RECTAL | Status: DC | PRN

## 2014-03-21 MED ORDER — SODIUM CHLORIDE 0.9 % IV SOLN
2.0000 g | Freq: Once | INTRAVENOUS | Status: AC
  Administered 2014-03-21: 2 g via INTRAVENOUS
  Filled 2014-03-21: qty 2000

## 2014-03-21 MED ORDER — MEASLES, MUMPS & RUBELLA VAC ~~LOC~~ INJ
0.5000 mL | INJECTION | Freq: Once | SUBCUTANEOUS | Status: DC
  Filled 2014-03-21: qty 0.5

## 2014-03-21 MED ORDER — LACTATED RINGERS IV SOLN
INTRAVENOUS | Status: DC
  Administered 2014-03-21: 125 mL/h via INTRAVENOUS

## 2014-03-21 MED ORDER — TETANUS-DIPHTH-ACELL PERTUSSIS 5-2.5-18.5 LF-MCG/0.5 IM SUSP
0.5000 mL | Freq: Once | INTRAMUSCULAR | Status: AC
  Administered 2014-03-22: 0.5 mL via INTRAMUSCULAR
  Filled 2014-03-21: qty 0.5

## 2014-03-21 MED ORDER — LACTATED RINGERS IV SOLN: 500.0000 mL | INTRAVENOUS | Status: DC | PRN

## 2014-03-21 MED ORDER — FENTANYL BOLUS VIA INFUSION
100.0000 ug | INTRAVENOUS | Status: DC | PRN
  Filled 2014-03-21: qty 100

## 2014-03-21 MED ORDER — OXYTOCIN 40 UNITS IN LACTATED RINGERS INFUSION - SIMPLE MED
62.5000 mL/h | INTRAVENOUS | Status: DC | PRN
Start: 2014-03-21 — End: 2014-03-23

## 2014-03-21 MED ORDER — LIDOCAINE HCL (PF) 1 % IJ SOLN
30.0000 mL | INTRAMUSCULAR | Status: DC | PRN
  Filled 2014-03-21: qty 30

## 2014-03-21 MED ORDER — BENZOCAINE-MENTHOL 20-0.5 % EX AERO
1.0000 "application " | INHALATION_SPRAY | CUTANEOUS | Status: DC | PRN
  Administered 2014-03-22: 1 via TOPICAL
  Filled 2014-03-21: qty 56

## 2014-03-21 MED ORDER — BISACODYL 10 MG RE SUPP: 10.0000 mg | Freq: Every day | RECTAL | Status: DC | PRN

## 2014-03-21 MED ORDER — CITRIC ACID-SODIUM CITRATE 334-500 MG/5ML PO SOLN: 30.0000 mL | ORAL | Status: DC | PRN

## 2014-03-21 MED ORDER — ONDANSETRON HCL 4 MG/2ML IJ SOLN: 4.0000 mg | INTRAMUSCULAR | Status: DC | PRN

## 2014-03-21 MED ORDER — DIPHENHYDRAMINE HCL 25 MG PO CAPS: 25.0000 mg | ORAL_CAPSULE | Freq: Four times a day (QID) | ORAL | Status: DC | PRN

## 2014-03-21 MED ORDER — OXYTOCIN BOLUS FROM INFUSION: 500.0000 mL | INTRAVENOUS | Status: DC

## 2014-03-21 MED ORDER — IBUPROFEN 600 MG PO TABS
600.0000 mg | ORAL_TABLET | Freq: Four times a day (QID) | ORAL | Status: DC | PRN
  Administered 2014-03-21: 600 mg via ORAL
  Filled 2014-03-21: qty 1

## 2014-03-21 MED ORDER — IBUPROFEN 600 MG PO TABS
600.0000 mg | ORAL_TABLET | Freq: Four times a day (QID) | ORAL | Status: DC
  Administered 2014-03-22 (×3): 600 mg via ORAL
  Filled 2014-03-21 (×4): qty 1

## 2014-03-21 MED ORDER — FERROUS SULFATE 325 (65 FE) MG PO TABS
325.0000 mg | ORAL_TABLET | Freq: Two times a day (BID) | ORAL | Status: DC
  Administered 2014-03-22: 325 mg via ORAL
  Filled 2014-03-21 (×3): qty 1

## 2014-03-21 MED ORDER — METHYLERGONOVINE MALEATE 0.2 MG/ML IJ SOLN: 0.2000 mg | INTRAMUSCULAR | Status: DC | PRN

## 2014-03-21 MED ORDER — FENTANYL CITRATE 0.05 MG/ML IJ SOLN
100.0000 ug | Freq: Once | INTRAMUSCULAR | Status: AC
  Administered 2014-03-21: 100 ug via INTRAVENOUS
  Filled 2014-03-21: qty 2

## 2014-03-21 MED ORDER — METHYLERGONOVINE MALEATE 0.2 MG PO TABS: 0.2000 mg | ORAL_TABLET | ORAL | Status: DC | PRN

## 2014-03-21 MED ORDER — ONDANSETRON HCL 4 MG PO TABS: 4.0000 mg | ORAL_TABLET | ORAL | Status: DC | PRN

## 2014-03-21 MED ORDER — SIMETHICONE 80 MG PO CHEW: 80.0000 mg | CHEWABLE_TABLET | ORAL | Status: DC | PRN

## 2014-03-21 MED ORDER — PRENATAL MULTIVITAMIN CH
1.0000 | ORAL_TABLET | Freq: Every day | ORAL | Status: DC
  Administered 2014-03-22: 1 via ORAL
  Filled 2014-03-21: qty 1

## 2014-03-21 MED ORDER — OXYCODONE-ACETAMINOPHEN 5-325 MG PO TABS
1.0000 | ORAL_TABLET | ORAL | Status: DC | PRN
  Administered 2014-03-21: 1 via ORAL
  Filled 2014-03-21 (×2): qty 1

## 2014-03-21 MED ORDER — SENNOSIDES-DOCUSATE SODIUM 8.6-50 MG PO TABS
2.0000 | ORAL_TABLET | ORAL | Status: DC
  Administered 2014-03-22 (×2): 2 via ORAL
  Filled 2014-03-21 (×2): qty 2

## 2014-03-21 MED ORDER — ONDANSETRON HCL 4 MG/2ML IJ SOLN
4.0000 mg | Freq: Four times a day (QID) | INTRAMUSCULAR | Status: DC | PRN
  Administered 2014-03-21: 4 mg via INTRAVENOUS
  Filled 2014-03-21: qty 2

## 2014-03-21 MED ORDER — OXYCODONE-ACETAMINOPHEN 5-325 MG PO TABS
1.0000 | ORAL_TABLET | ORAL | Status: DC | PRN
  Administered 2014-03-22: 2 via ORAL
  Administered 2014-03-22: 1 via ORAL
  Administered 2014-03-22 (×2): 2 via ORAL
  Administered 2014-03-22: 1 via ORAL
  Filled 2014-03-21: qty 1
  Filled 2014-03-21 (×3): qty 2
  Filled 2014-03-21: qty 1

## 2014-03-21 MED ORDER — PRENATAL MULTIVITAMIN CH: 1.0000 | ORAL_TABLET | Freq: Every day | ORAL | Status: DC

## 2014-03-21 MED ORDER — ACETAMINOPHEN 325 MG PO TABS: 650.0000 mg | ORAL_TABLET | ORAL | Status: DC | PRN

## 2014-03-21 MED ORDER — LANOLIN HYDROUS EX OINT: TOPICAL_OINTMENT | CUTANEOUS | Status: DC | PRN

## 2014-03-21 MED ORDER — WITCH HAZEL-GLYCERIN EX PADS: 1.0000 "application " | MEDICATED_PAD | CUTANEOUS | Status: DC | PRN

## 2014-03-21 NOTE — H&P (Signed)
Attestation of Attending Supervision of Advanced Practitioner (PA/CNM/NP): Evaluation and management procedures were performed by the Advanced Practitioner under my supervision and collaboration.  I have reviewed the Advanced Practitioner's note and chart, and I agree with the management and plan.  Reva BoresPRATT,Arista Kettlewell S, MD [facility] Attending 03/21/2014 4:47 PM

## 2014-03-21 NOTE — MAU Note (Signed)
Pt states she has been having contractions since 1cm on tuiesday in the clinic and now she is 3cm in clinic, pt states she is having strong contractions

## 2014-03-21 NOTE — Progress Notes (Signed)
C/o scant spotting since yesterday. C/o painful contractions.  Dr. Loreta AveAcosta in to do cervical check. Orders to sent to MAU for evaluation. Report called to MAU charge RN Early OsmondJolynn

## 2014-03-21 NOTE — MAU Provider Note (Signed)
Attestation of Attending Supervision of Advanced Practitioner (PA/CNM/NP): Evaluation and management procedures were performed by the Advanced Practitioner under my supervision and collaboration.  I have reviewed the Advanced Practitioner's note and chart, and I agree with the management and plan.  Yanky Vanderburg S, MD [facility] Attending 03/21/2014 4:47 PM   

## 2014-03-21 NOTE — Lactation Note (Signed)
This note was copied from the chart of [place]. Lactation Consultation Note  Patient Name: Boy Erlene Devita JASNK'N Date: 03/21/2014 Reason for consult: Initial assessment;Infant < 6lbs;Late preterm infant at 2 hours postpartum. Mom is multipara but did not breastfeed or pump for her older children, a 32 yo son and an 19 yo daughter.  Baby is receiving formula at this feeding based on recent blood sugar (46) and mom having large nipples making latch to breast difficult.  DEBP and kit are in room and either RN or LC tonight will assist mom to initiate pumping and hand expression.  LC gave mom the LPI handouts and reviewed special needs of LPI for warmth, STS and a minimum of q3h feedings with supplement if not breastfeeding vigorously with swallows. Mom encouraged to feed baby 8-12 times/24 hours and with feeding cues. LC encouraged review of Baby and Me pp 9, 14 and 20-25 for STS and BF information. LC provided [employer] Resource brochure and reviewed [facility] services and list of community and web site resources.   Maternal Data Formula Feeding for Exclusion: No Has patient been taught Hand Expression?: No (RN states she will demonstrate later and assist w/DEBP) Does the patient have breastfeeding experience prior to this delivery?: No (Mom is multipara but did not breastfeed or pump for her older children, a 47 yo son and an 19 yo daughter.)  Feeding Feeding Type: Formula Nipple Type: Slow - flow  LATCH Score/Interventions         No latch yet             Lactation Tools Discussed/Used Initiated by::  (pump in room but mom eating after recent admission and RN or LC will assist w/pump later) STS LPI feeding needs  Consult Status Consult Status: Follow-up Date: 03/22/14 Follow-up type: In-patient    Junious Dresser [facility] 03/21/2014, 10:27 PM

## 2014-03-21 NOTE — H&P (Signed)
[name] is a 33 y.o. female [id number] well-dated at 648w0d presenting for labor check.  Cx was 3 cm in clinic and 1 cm earlier this week. Contracting x 2 days. Spotting after exam 2 days ago and again today. Pt in fetal testing for lagging fetal growth, high normal UA Dopplers, calcified placenta (21st%ile, AFI 14) Children are 15 and 378 yo.   Maternal Medical History:  Reason for admission: Nausea.   Error above smart block : no nausea  OB History   Grav Para Term Preterm Abortions TAB SAB Ect Mult Living   5 2 2  0 2 1 0 0 0 2     Past Medical History  Diagnosis Date  . Scoliosis   . Asthma   . Abnormal Pap smear 2011    repeat WNL; cryo 20011   Past Surgical History  Procedure Laterality Date  . Hand surgery Left   . Dilation and curettage of uterus     Family History: family history includes Diabetes in her mother; Sickle cell anemia in her mother. There is no history of Anesthesia problems. Social History:  reports that she has been smoking Cigarettes.  She has been smoking about 0.35 packs per day. She has never used smokeless tobacco. She reports that she does not drink alcohol or use illicit drugs.   Prenatal Transfer Tool  Maternal Diabetes: No Genetic Screening: Normal Maternal Ultrasounds/Referrals: Normal Fetal Ultrasounds or other Referrals:  None Maternal Substance Abuse:  Yes:  Type: Marijuana Significant Maternal Medications:  None Significant Maternal Lab Results:  Lab values include: Other: GBS pending from 8/18 Other Comments:  None  Review of Systems  Constitutional: Negative for fever and malaise/fatigue.  Respiratory: Negative for cough.   Cardiovascular: Negative for chest pain.  Gastrointestinal: Negative for nausea and vomiting.  Genitourinary: Negative for dysuria.  Neurological: Positive for headaches.    Dilation: 7 Effacement (%): 80 Station: -1 Exam by:: D. Michalene Debruler, CNM Last menstrual period 07/12/2013. Maternal Exam:  Uterine  Assessment: Contraction strength is firm.  Contraction frequency is regular.   Abdomen: Estimated fetal weight is 5 1/2#.   Fetal presentation: vertex  Introitus: Normal vulva. Normal vagina.  Vagina is negative for discharge.  Ferning test: not done.  Nitrazine test: not done. Amniotic fluid character: not assessed.  Pelvis: adequate for delivery.   Cervix: Cervix evaluated by digital exam.     Fetal Exam Fetal Monitor Review: Mode: ultrasound.   Baseline rate: 120.  Variability: moderate (6-25 bpm).   Pattern: accelerations present and no decelerations.    Fetal State Assessment: Category I - tracings are normal.     Physical Exam  Nursing note and vitals reviewed. Constitutional: She is oriented to person, place, and time. She appears well-developed and well-nourished. She appears distressed.  HENT:  Head: Normocephalic.  Neck: No thyromegaly present.  Cardiovascular: Normal rate, regular rhythm and normal heart sounds.   Respiratory: Effort normal and breath sounds normal.  GI: Soft. There is no tenderness.  Genitourinary: Vagina normal. No vaginal discharge found.  Musculoskeletal: Normal range of motion.  Neurological: She is alert and oriented to person, place, and time.  Skin: Skin is warm.  Psychiatric: She has a normal mood and affect. Her behavior is normal. Judgment and thought content normal.    Prenatal labs: ABO, Rh: --/Positive/-- (02/23 0000) Antibody: Negative (02/23 0000) Rubella: Immune (02/23 0000) RPR: NON REAC (06/24 1549)  HBsAg: Negative (02/23 0000)  HIV: NONREACTIVE (06/24 1549)  GBS:   unknown (  sent 03/19/14) 1 hr OGTT 96  Assessment/Plan: Preterm active labor> admit for expectant management Category 1 FHR GBS unknown> treat AMP IV since preterm    Fartun Paradiso 03/21/2014, 4:17 PM

## 2014-03-21 NOTE — MAU Note (Signed)
Not in lobby #2 

## 2014-03-21 NOTE — Plan of Care (Signed)
Problem: Consults Goal: Birthing Suites Patient Information Press F2 to bring up selections list  Outcome: Completed/Met Date Met:  03/21/14  Pt < [redacted] weeks EGA  Problem: Phase I Progression Outcomes Goal: Pain controlled with appropriate interventions Outcome: Completed/Met Date Met:  03/21/14 Patient given IV pain medicine informed of side effects

## 2014-03-21 NOTE — MAU Note (Signed)
Not in lobby

## 2014-03-21 NOTE — MAU Provider Note (Signed)
Chief Complaint:  Contractions   First Provider Initiated Contact with Patient 03/21/14 1556      HPI: Holly DykesCandice Salas is a 33 y.o. N8G9562G5P2022 at 5022w0d who presents to maternity admissions reporting painful UCs and spotting. No leakage of fluid. Good fetal movement.   [facility]  Past Medical History: Past Medical History  Diagnosis Date  . Scoliosis   . Asthma   . Abnormal Pap smear 2011    repeat WNL; cryo 20011    Past obstetric history: OB History  Gravida Para Term Preterm AB SAB TAB Ectopic Multiple Living  5 2 2  0 2 0 1 0 0 2    # Outcome Date GA Lbr Len/2nd Weight Sex Delivery Anes PTL Lv  5 CUR           4 ABT 2012          3 TAB 2010          2 TRM 11/25/05 8570w0d  2.608 kg (5 lb 12 oz)  SVD   Y     Comments: Hyperemesis  1 TRM 04/25/99 7132w0d  2.948 kg (6 lb 8 oz) M SVD   Y      Past Surgical History: Past Surgical History  Procedure Laterality Date  . Hand surgery Left   . Dilation and curettage of uterus       Family History: Family History  Problem Relation Age of Onset  . Anesthesia problems Neg Hx   . Diabetes Mother   . Sickle cell anemia Mother     Social History: History  Substance Use Topics  . Smoking status: Current Every Day Smoker -- 0.35 packs/day    Types: Cigarettes  . Smokeless tobacco: Never Used  . Alcohol Use: No     Comment: occasionally    Allergies:  Allergies  Allergen Reactions  . Promethazine Hcl Palpitations    Makes her jittery    Meds:  Prescriptions prior to admission  Medication Sig Dispense Refill  . ENSURE PLUS (ENSURE PLUS) LIQD Drink 1 container (237ml) TID between meals.  946 mL  10  . ondansetron (ZOFRAN ODT) 8 MG disintegrating tablet Take 1 tablet (8 mg total) by mouth every 8 (eight) hours as needed for nausea or vomiting.  20 tablet  0  . metoCLOPramide (REGLAN) 10 MG tablet Take 1 tablet (10 mg total) by mouth 4 (four) times daily.  120 tablet  1    ROS: Pertinent findings in history  of present illness.  Physical Exam   See Admission H&P. SVE 7/80/-1 Assessment: Active PT labor  Plan: Admit  Danae OrleansDeirdre C Telly Jawad, CNM 03/21/2014 3:56 PM

## 2014-03-22 LAB — CULTURE, BETA STREP (GROUP B ONLY)

## 2014-03-22 MED ORDER — COMPLETENATE 29-1 MG PO CHEW
1.0000 | CHEWABLE_TABLET | Freq: Every day | ORAL | Status: DC
Start: 2014-03-23 — End: 2014-03-23
  Filled 2014-03-22 (×2): qty 1

## 2014-03-22 MED ORDER — IBUPROFEN 100 MG/5ML PO SUSP
600.0000 mg | Freq: Four times a day (QID) | ORAL | Status: DC
  Administered 2014-03-23: 600 mg via ORAL
  Filled 2014-03-22 (×7): qty 30

## 2014-03-22 NOTE — Lactation Note (Signed)
This note was copied from the chart of [name]. Lactation Consultation Note  Mother states she does not want to breastfeed or pump. Baby is currently taking hydrolyzed formula. Spoke with resident regarding type of formula to continue.  He stated he will follow up with [name] MD.  Patient Name: [name] Today's Date: 03/22/2014     Maternal Data    Feeding    LATCH Score/Interventions                      Lactation Tools Discussed/Used     Consult Status      Dahlia ByesBerkelhammer, Hally Colella Boschen 03/22/2014, 2:22 PM

## 2014-03-22 NOTE — Progress Notes (Signed)
UR chart review completed.  

## 2014-03-22 NOTE — Lactation Note (Signed)
This note was copied from the chart of Holly OfficeMax IncorporatedCandice Giglia. Lactation Consultation Note  Patient Name: Holly Paul DykesCandice Salas ONGEX'BToday's Date: 03/22/2014 Reason for consult: Follow-up assessment;Other (Comment) (exclusion based on mom's choice after delivery)   Maternal Data Formula Feeding for Exclusion: Yes Reason for exclusion: Mother's choice to formula and breast feed on admission;Substance abuse and/or alcohol abuse  Feeding Feeding Type: Bottle Fed - Formula Nipple Type: Slow - flow  LATCH Score/Interventions                      Lactation Tools Discussed/Used     Consult Status Consult Status: Complete    Lynda RainwaterBryant, [name] 03/22/2014, 4:23 PM

## 2014-03-22 NOTE — Progress Notes (Signed)
Clinical Social Work Department PSYCHOSOCIAL ASSESSMENT - MATERNAL/CHILD 03/22/2014  Patient:  HollyHolly Salas  Account Number:  [id number]  Admit Date:  03/21/2014  Childs Name:   MOB has not named baby yet    Clinical Social Worker:  Mekia Dipinto, LCSW   Date/Time:  03/22/2014 01:00 PM  Date Referred:  03/22/2014   Referral source  Physician     Referred reason  Substance Abuse   Other referral source:    I:  FAMILY / HOME ENVIRONMENT Child's legal guardian:  PARENT  Guardian - Name Guardian - Age Guardian - Address  [name] 32 [address] [place], [place] [address]   Other household support members/support persons Name Relationship DOB   SON 15 year old   DAUGHTER 8 year old   Other support:   MOB reports that her family is supportive.    II  PSYCHOSOCIAL DATA Information Source:  Patient Interview  Financial and Community Resources Employment:   Unemployed   Financial resources:  Medicaid If Medicaid - County:  [place] Other  WIC  Food Stamps   School / Grade:   Maternity Care Coordinator / Child Services Coordination / Early Interventions:   MOB reports she received prenatal care but no birthing/parenting classes because she felt well prepared.  Cultural issues impacting care:   None reported    III  STRENGTHS Strengths  Home prepared for Child (including basic supplies)  Supportive family/friends   Strength comment:  MOB reports all supplies are ready for baby and she has a bassinet ready for him to sleep in. MOB reports her family will assist if needed.   IV  RISK FACTORS AND CURRENT PROBLEMS Current Problem:  YES   Risk Factor & Current Problem Patient Issue Family Issue Risk Factor / Current Problem Comment  Substance Abuse Y N THC use during pregnancy    V  SOCIAL WORK ASSESSMENT CSW received referral due to MOB admitted to THC use during pregnancy. CSW reviewed chart and spoke with bedside RN prior to meeting with MOB. CSW met  with MOB at bedside. CSW introduced myself and explained role.    MOB reports that this is her third child but she feels that she is doing well. MOB lives at home with her 15 and 8 yo children. MOB reports that FOB (Clifford) is somewhat involved but she does not know how supportive he will be for baby. MOB reports that her family lives in [place] and will assist as needed. MOB reports she is "over-prepared" for baby and has all supplies ready for baby. MOB reports she will contact Medicaid, food stamps, and WIC re: baby's arrival.    CSW spoke with MOB about her pregnancy and THC use. MOB reports that she was diagnosed with hyperemesis and that when she used THC to manage nausea and to be able to eat food. MOB reports she went to ED several times for IV fluids and wanted to be able to eat while pregnant. Now that baby is born, MOB reports she no longer needs to use THC. MOB denies any further drug use during pregnancy. CSW informed MOB of drug screen that would occur on baby. MOB reports understanding and reports "you have to do what you have to do."    MOB reports no further concerns and reports no needs at this time.      VI SOCIAL WORK PLAN Social Work Plan  No Further Intervention Required / No Barriers to Discharge   Type of pt/family education:     Drug screen on baby   If child protective services report - county:   If child protective services report - date:   Information/referral to community resources comment:   MOB reports no resources needed at this time-MOB reports she has already chosen [facility] for pediatrician and will follow up as needed   Other social work plan:   No barriers to DC. CSW will continue to follow and make CPS report if drug screen is positive.    Trust Crago, LCSW (Coverage for Sarah Venning) 

## 2014-03-22 NOTE — Progress Notes (Signed)
Holly Dykesandice Salas [phone]([id number]) had NSVD @2130  yesterday. Patient is recovering well. She's decided to use Depo as her form of contraception. Intends on being discharged tomorrow. Is bottlefeeding.  General: Well developed, well nourished CV: S1+S2 distinct, no murmurs heard Lungs: Clear to auscultation, no adventitious sounds heard  PV/DVT Precaution: Pulses felt in extremities. Skin is warm without evidence of edema  Abdomen: Not tender to light and deep palpation.   Nl PP course  Anticipate d/c in AM 03/23/14. See note from SW re no barriers to discharge (hx substance abuse).  Cam HaiSHAW, KIMBERLY CNM 03/22/2014 3:43 PM

## 2014-03-23 MED ORDER — FERROUS SULFATE 325 (65 FE) MG PO TABS: 325.0000 mg | ORAL_TABLET | Freq: Two times a day (BID) | ORAL | Status: DC

## 2014-03-23 MED ORDER — DOCUSATE SODIUM 100 MG PO CAPS: 100.0000 mg | ORAL_CAPSULE | Freq: Two times a day (BID) | ORAL | Status: DC

## 2014-03-23 NOTE — Progress Notes (Signed)
Patient states she needs to leave and take care of some things at home before the baby arrives to the home. Patient states she wants to leave the baby as a nursery baby .Mom's discharge instructions were reviewed and instructions on visiting baby in nursery, calling for report on baby, and the importance of leaving her baby identification tag intact on her arm were reviewed. Patient verbalized her understanding prior to discharge. Baby taken to nursery by RN prior to patient leaving the building. Boykin PeekNancy Furqan Gosselin, RN

## 2014-03-23 NOTE — Discharge Summary (Signed)
Obstetric Discharge Summary Reason for Admission: onset of labor Prenatal Procedures: NST Intrapartum Procedures: spontaneous vaginal delivery and GBS prophylaxis Postpartum Procedures: none Complications-Operative and Postpartum: none Hemoglobin  Date Value Ref Range Status  03/21/2014 11.2* 12.0 - 15.0 g/dL Final     HCT  Date Value Ref Range Status  03/21/2014 31.6* 36.0 - 46.0 % Final    Discharge Diagnoses: Term Pregnancy-delivered  Hospital Course:  Paul DykesCandice Cai is a 33 y.o. [id number] who presented with SOL.  She had a uncomplicated SVD. She was able to ambulate, tolerate PO and void normally. She was discharged home with instructions for postpartum care.    Delivery Note  At 8:19 PM a viable female was delivered via Vaginal, Spontaneous Delivery (Presentation: LOA; ). APGAR: 8, 9; weight 4 lb 11.3 oz (2135 g).  Placenta status: Intact, Spontaneous. Cord: 3 vessels with the following complications: None.  Anesthesia: None  Episiotomy: None  Lacerations: None  Suture Repair: n/a  Est. Blood Loss (mL): 250  Mom to postpartum. Baby to Couplet care / Skin to Skin.  CRESENZO-DISHMAN,FRANCES  03/21/2014, 9:32 PM  Delivery by Shearon BaloKarim Ghanem, PA-S under my supervision   Physical Exam:  General: alert and cooperative Lochia: appropriate Uterine Fundus: firm DVT Evaluation: No evidence of DVT seen on physical exam. Negative Homan's sign. No cords or calf tenderness. No significant calf/ankle edema.  Discharge Information:   Follow-up Information   [facility] Schedule an appointment as soon as possible for a visit in 4 weeks. (For your postpartum appointment.)    Contact information:   [address] [place] [id number] [phone]     Date: 03/23/2014 Activity: unrestricted Diet: routine Medications: Ibuprofen and Colace Baby feeding: plans to breastfeed Contraception: Depo-Provera Condition: stable Instructions: refer to practice  specific booklet Discharge to: home   Newborn Data: Live born female  Birth Weight: 4 lb 11.3 oz (2135 g) APGAR: 8, 9  Wenda LowJames Joyner, MD [facility] PGY-1 03/23/2014, 8:51 AM  I have seen and examined this patient and I agree with the above.  Pt will have a SW follow-up over insufficient feedings that she is giving the baby despite baby's small size. Cam HaiSHAW, Aodhan Scheidt CNM 9:12 AM 03/23/2014

## 2014-03-23 NOTE — Discharge Instructions (Signed)

## 2014-03-23 NOTE — Progress Notes (Signed)
UDS on newborn was positive for marijuana.  Report made to Dan Reppert with DSS. Met with mother to inform her of referral made to DSS due to the baby's positive drug screen.  Mother made no eye contact.  She focused her attention on her cell phone and rarely looked away as spoke with CSW.  She communicated her irritation with not being able to go home because the baby may not be discharged.  Patient asked what what happen next since the report was made to DSS.  Rec'd call from Mr. Reppert while visiting with mother and informed that he decided to come to the hospital to see mother prior to her discharge.  Mother was informed of this and stated "I know him".  She reportedly had a previous referral to DSS for another child that tested positive for marijuana at birth.  RN caring for mother informed of above.     

## 2014-03-23 NOTE — Progress Notes (Signed)
Patient was seen today by CPI Reppert.  DSS will follow up with mother and baby in the community after discharge from the hospital.

## 2014-03-23 NOTE — Progress Notes (Signed)
At times patient's personality can change drastically from  Polite to agitated. Always on the phone when attempting to assess, give pain meds or assess. . No support person in the room.

## 2014-03-25 ENCOUNTER — Encounter: Payer: Self-pay | Admitting: Obstetrics and Gynecology

## 2014-03-26 ENCOUNTER — Other Ambulatory Visit: Payer: Medicaid Other

## 2014-03-26 ENCOUNTER — Encounter: Payer: Self-pay | Admitting: Family Medicine

## 2014-03-28 ENCOUNTER — Encounter: Payer: Medicaid Other | Admitting: Obstetrics & Gynecology

## 2014-05-02 ENCOUNTER — Ambulatory Visit (INDEPENDENT_AMBULATORY_CARE_PROVIDER_SITE_OTHER): Payer: Medicaid Other | Admitting: Family Medicine

## 2014-05-02 ENCOUNTER — Encounter: Payer: Self-pay | Admitting: Family Medicine

## 2014-05-02 VITALS — BP 105/68 | HR 67 | Temp 98.5°F | Ht 59.0 in | Wt 99.9 lb

## 2014-05-02 DIAGNOSIS — Z3042 Encounter for surveillance of injectable contraceptive: Secondary | ICD-10-CM

## 2014-05-02 DIAGNOSIS — Z23 Encounter for immunization: Secondary | ICD-10-CM

## 2014-05-02 LAB — POCT PREGNANCY, URINE: PREG TEST UR: NEGATIVE

## 2014-05-02 MED ORDER — MEDROXYPROGESTERONE ACETATE 150 MG/ML IM SUSP: 150.0000 mg | Freq: Once | INTRAMUSCULAR | Status: DC

## 2014-05-02 MED ORDER — MEDROXYPROGESTERONE ACETATE 104 MG/0.65ML ~~LOC~~ SUSP
104.0000 mg | Freq: Once | SUBCUTANEOUS | Status: AC
  Administered 2014-05-02: 104 mg via SUBCUTANEOUS

## 2014-05-02 NOTE — Addendum Note (Signed)
Addended by: Kathee DeltonHILLMAN, Jacion Dismore L on: 05/02/2014 04:21 PM   Modules accepted: Orders

## 2014-05-02 NOTE — Patient Instructions (Signed)
Postpartum Depression and Baby Blues The postpartum period begins right after the birth of a baby. During this time, there is often a great amount of joy and excitement. It is also a time of many changes in the life of the parents. Regardless of how many times a mother gives birth, each child brings new challenges and dynamics to the family. It is not unusual to have feelings of excitement along with confusing shifts in moods, emotions, and thoughts. All mothers are at risk of developing postpartum depression or the "baby blues." These mood changes can occur right after giving birth, or they may occur many months after giving birth. The baby blues or postpartum depression can be mild or severe. Additionally, postpartum depression can go away rather quickly, or it can be a long-term condition.  CAUSES Raised hormone levels and the rapid drop in those levels are thought to be a main cause of postpartum depression and the baby blues. A number of hormones change during and after pregnancy. Estrogen and progesterone usually decrease right after the delivery of your baby. The levels of thyroid hormone and various cortisol steroids also rapidly drop. Other factors that play a role in these mood changes include major life events and genetics.  RISK FACTORS If you have any of the following risks for the baby blues or postpartum depression, know what symptoms to watch out for during the postpartum period. Risk factors that may increase the likelihood of getting the baby blues or postpartum depression include:  Having a personal or family history of depression.   Having depression while being pregnant.   Having premenstrual mood issues or mood issues related to oral contraceptives.  Having a lot of life stress.   Having marital conflict.   Lacking a social support network.   Having a baby with special needs.   Having health problems, such as diabetes.  SIGNS AND SYMPTOMS Symptoms of baby blues  include:  Brief changes in mood, such as going from extreme happiness to sadness.  Decreased concentration.   Difficulty sleeping.   Crying spells, tearfulness.   Irritability.   Anxiety.  Symptoms of postpartum depression typically begin within the first month after giving birth. These symptoms include:  Difficulty sleeping or excessive sleepiness.   Marked weight loss.   Agitation.   Feelings of worthlessness.   Lack of interest in activity or food.  Postpartum psychosis is a very serious condition and can be dangerous. Fortunately, it is rare. Displaying any of the following symptoms is cause for immediate medical attention. Symptoms of postpartum psychosis include:   Hallucinations and delusions.   Bizarre or disorganized behavior.   Confusion or disorientation.  DIAGNOSIS  A diagnosis is made by an evaluation of your symptoms. There are no medical or lab tests that lead to a diagnosis, but there are various questionnaires that a health care provider may use to identify those with the baby blues, postpartum depression, or psychosis. Often, a screening tool called the Edinburgh Postnatal Depression Scale is used to diagnose depression in the postpartum period.  TREATMENT The baby blues usually goes away on its own in 1-2 weeks. Social support is often all that is needed. You will be encouraged to get adequate sleep and rest. Occasionally, you may be given medicines to help you sleep.  Postpartum depression requires treatment because it can last several months or longer if it is not treated. Treatment may include individual or group therapy, medicine, or both to address any social, physiological, and psychological   factors that may play a role in the depression. Regular exercise, a healthy diet, rest, and social support may also be strongly recommended.  Postpartum psychosis is more serious and needs treatment right away. Hospitalization is often needed. HOME CARE  INSTRUCTIONS  Get as much rest as you can. Nap when the baby sleeps.   Exercise regularly. Some women find yoga and walking to be beneficial.   Eat a balanced and nourishing diet.   Do little things that you enjoy. Have a cup of tea, take a bubble bath, read your favorite magazine, or listen to your favorite music.  Avoid alcohol.   Ask for help with household chores, cooking, grocery shopping, or running errands as needed. Do not try to do everything.   Talk to people close to you about how you are feeling. Get support from your partner, family members, friends, or other new moms.  Try to stay positive in how you think. Think about the things you are grateful for.   Do not spend a lot of time alone.   Only take over-the-counter or prescription medicine as directed by your health care provider.  Keep all your postpartum appointments.   Let your health care provider know if you have any concerns.  SEEK MEDICAL CARE IF: You are having a reaction to or problems with your medicine. SEEK IMMEDIATE MEDICAL CARE IF:  You have suicidal feelings.   You think you may harm the baby or someone else. MAKE SURE YOU:  Understand these instructions.  Will watch your condition.  Will get help right away if you are not doing well or get worse. Document Released: 04/22/2004 Document Revised: 07/24/2013 Document Reviewed: 04/30/2013 ExitCare Patient Information 2015 [employer]. This information is not intended to replace advice given to you by your health care provider. Make sure you discuss any questions you have with your health care provider.  

## 2014-05-02 NOTE — Progress Notes (Signed)
  Subjective:     Paul DykesCandice Salas is a 33 y.o. female who presents for a postpartum visit. She is 6 weeks postpartum following a spontaneous vaginal delivery. I have fully reviewed the prenatal and intrapartum course. The delivery was at 36 gestational weeks. Outcome: spontaneous vaginal delivery. Anesthesia: epidural. Postpartum course has been good. Baby's course has been normal. Baby is feeding by bottle. Bleeding thin lochia. Bowel function is normal. Bladder function is normal. Patient is not sexually active. Contraception method is Depo-Provera injections. Postpartum depression screening: negative.  The following portions of the patient's history were reviewed and updated as appropriate: allergies, current medications, past family history, past medical history, past social history, past surgical history and problem list.  Review of Systems Pertinent items are noted in HPI.   Objective:    BP 105/68  Pulse 67  Temp(Src) 98.5 F (36.9 C)  Ht 4\' 11"  (1.499 m)  Wt 99 lb 14.4 oz (45.314 kg)  BMI 20.17 kg/m2  LMP 05/01/2014  Breastfeeding? No  General:  alert, cooperative and no distress  Lungs: clear to auscultation bilaterally  Heart:  regular rate and rhythm, S1, S2 normal, no murmur, click, rub or gallop  Abdomen: soft, non-tender; bowel sounds normal; no masses,  no organomegaly        Assessment:     6 postpartum exam. Pap smear not done at today's visit.   Plan:    1. Contraception: Depo-Provera injections 2. Follow up in: 1 year or as needed.

## 2014-06-03 ENCOUNTER — Encounter: Payer: Self-pay | Admitting: Family Medicine

## 2014-07-21 ENCOUNTER — Emergency Department (HOSPITAL_COMMUNITY)
Admission: EM | Admit: 2014-07-21 | Discharge: 2014-07-21 | Disposition: A | Discharge status: Home / Self Care | Payer: Medicaid Other | Attending: Emergency Medicine | Admitting: Emergency Medicine

## 2014-07-21 ENCOUNTER — Encounter (HOSPITAL_COMMUNITY): Payer: Self-pay | Admitting: Oncology

## 2014-07-21 DIAGNOSIS — Z720 Tobacco use: Secondary | ICD-10-CM | POA: Insufficient documentation

## 2014-07-21 DIAGNOSIS — F10920 Alcohol use, unspecified with intoxication, uncomplicated: Secondary | ICD-10-CM

## 2014-07-21 DIAGNOSIS — J45909 Unspecified asthma, uncomplicated: Secondary | ICD-10-CM | POA: Insufficient documentation

## 2014-07-21 DIAGNOSIS — Z3202 Encounter for pregnancy test, result negative: Secondary | ICD-10-CM | POA: Insufficient documentation

## 2014-07-21 DIAGNOSIS — M419 Scoliosis, unspecified: Secondary | ICD-10-CM | POA: Insufficient documentation

## 2014-07-21 DIAGNOSIS — Z79899 Other long term (current) drug therapy: Secondary | ICD-10-CM | POA: Diagnosis not present

## 2014-07-21 DIAGNOSIS — R112 Nausea with vomiting, unspecified: Secondary | ICD-10-CM | POA: Diagnosis present

## 2014-07-21 DIAGNOSIS — F12120 Cannabis abuse with intoxication, uncomplicated: Secondary | ICD-10-CM | POA: Insufficient documentation

## 2014-07-21 LAB — I-STAT CHEM 8, ED
BUN: 12 mg/dL (ref 6–23)
CALCIUM ION: 1.09 mmol/L — AB (ref 1.12–1.23)
Chloride: 109 mEq/L (ref 96–112)
Creatinine, Ser: 0.6 mg/dL (ref 0.50–1.10)
Glucose, Bld: 106 mg/dL — ABNORMAL HIGH (ref 70–99)
HCT: 37 % (ref 36.0–46.0)
Hemoglobin: 12.6 g/dL (ref 12.0–15.0)
Potassium: 3.6 mEq/L — ABNORMAL LOW (ref 3.7–5.3)
Sodium: 145 mEq/L (ref 137–147)
TCO2: 21 mmol/L (ref 0–100)

## 2014-07-21 LAB — URINE MICROSCOPIC-ADD ON

## 2014-07-21 LAB — URINALYSIS, ROUTINE W REFLEX MICROSCOPIC
Bilirubin Urine: NEGATIVE
Glucose, UA: NEGATIVE mg/dL
Leukocytes, UA: NEGATIVE
Nitrite: NEGATIVE
PH: 8 (ref 5.0–8.0)
PROTEIN: NEGATIVE mg/dL
Specific Gravity, Urine: 1.023 (ref 1.005–1.030)
Urobilinogen, UA: 0.2 mg/dL (ref 0.0–1.0)

## 2014-07-21 LAB — POC URINE PREG, ED: Preg Test, Ur: NEGATIVE

## 2014-07-21 MED ORDER — ONDANSETRON 4 MG PO TBDP
4.0000 mg | ORAL_TABLET | Freq: Once | ORAL | Status: AC
  Administered 2014-07-21: 4 mg via ORAL
  Filled 2014-07-21: qty 1

## 2014-07-21 MED ORDER — ONDANSETRON HCL 4 MG PO TABS: 4.0000 mg | ORAL_TABLET | Freq: Four times a day (QID) | ORAL | Status: DC

## 2014-07-21 NOTE — ED Notes (Signed)
Bed: WA14 Expected date:  Expected time:  Means of arrival:  Comments: EMS 

## 2014-07-21 NOTE — ED Provider Notes (Signed)
CSN: [id number]     Arrival date & time 07/21/14  0410 History   First MD Initiated Contact with Patient 07/21/14 0424     Chief Complaint  Patient presents with  . Emesis     (Consider location/radiation/quality/duration/timing/severity/associated sxs/prior Treatment) HPI Comments: Pt comes in with cc of emesis. PT states that she started drinking alcohol early in the evening, and by night time, she started having emesis. She had 15+ episodes, bilious. She continued to dry heave, and thus she called EMS. Pt received zofran and 500 cc bolus en route. States that she is still nauseated, but feeling better and hasnt had any emesis since the meds were given.  Patient is a 33 y.o. female presenting with vomiting. The history is provided by the patient.  Emesis Associated symptoms: no abdominal pain     Past Medical History  Diagnosis Date  . Scoliosis   . Asthma   . Abnormal Pap smear 2011    repeat WNL; cryo 20011   Past Surgical History  Procedure Laterality Date  . Hand surgery Left   . Dilation and curettage of uterus     Family History  Problem Relation Age of Onset  . Anesthesia problems Neg Hx   . Diabetes Mother   . Sickle cell anemia Mother    History  Substance Use Topics  . Smoking status: Current Every Day Smoker -- 0.35 packs/day    Types: Cigarettes  . Smokeless tobacco: Never Used  . Alcohol Use: Yes     Comment: occasionally   OB History    Gravida Para Term Preterm AB TAB SAB Ectopic Multiple Living   5 3 2 1 2 1  0 0 0 3     Review of Systems  Constitutional: Positive for activity change.  Gastrointestinal: Positive for nausea and vomiting. Negative for abdominal pain.  Neurological: Negative for dizziness.  Psychiatric/Behavioral: Negative for confusion.      Allergies  Promethazine hcl  Home Medications   Prior to Admission medications   Medication Sig Start Date End Date Taking? Authorizing Provider  ferrous sulfate 325 (65 FE) MG  tablet Take 1 tablet (325 mg total) by mouth 2 (two) times daily with a meal. Patient not taking: Reported on 07/21/2014 03/23/14   Jamal CollinJames R Joyner, MD   BP 96/53 mmHg  Pulse 60  Temp(Src) 99.1 F (37.3 C) (Oral)  Resp 14  SpO2 96% Physical Exam  Constitutional: She is oriented to person, place, and time. She appears well-developed.  Eyes: Conjunctivae are normal.  Neck: Neck supple.  Cardiovascular: Normal rate.   Pulmonary/Chest: Effort normal.  Abdominal: Soft. There is no tenderness.  Neurological: She is alert and oriented to person, place, and time.  Nursing note and vitals reviewed.   ED Course  Procedures (including critical care time) Labs Review Labs Reviewed  I-STAT CHEM 8, ED - Abnormal; Notable for the following:    Potassium 3.6 (*)    Glucose, Bld 106 (*)    Calcium, Ion 1.09 (*)    All other components within normal limits  URINALYSIS, ROUTINE W REFLEX MICROSCOPIC  POC URINE PREG, ED    Imaging Review No results found.   EKG Interpretation None      MDM   Final diagnoses:  Alcohol intoxication, uncomplicated   Pt comes in with cc of nausea, emesis. She had heavy alcohol consumption, and has resultant emesis. She is alert and oriented. Pt started on oral hydration, and given zofran ODT.  Patient is  clinically sober. She is talking coherently, gait is normal, and is demonstrating rational thought process. She is now feeling a lot better, and is stable for discharge.     Derwood KaplanAnkit Halima Fogal, MD 07/21/14 [phone]

## 2014-07-21 NOTE — Discharge Instructions (Signed)
Alcohol Intoxication  Alcohol intoxication occurs when the amount of alcohol that a person has consumed impairs his or her ability to mentally and physically function. Alcohol directly impairs the normal chemical activity of the brain. Drinking large amounts of alcohol can lead to changes in mental function and behavior, and it can cause many physical effects that can be harmful.   Alcohol intoxication can range in severity from mild to very severe. Various factors can affect the level of intoxication that occurs, such as the person's age, gender, weight, frequency of alcohol consumption, and the presence of other medical conditions (such as diabetes, seizures, or heart conditions). Dangerous levels of alcohol intoxication may occur when people drink large amounts of alcohol in a short period (binge drinking). Alcohol can also be especially dangerous when combined with certain prescription medicines or "recreational" drugs.  SIGNS AND SYMPTOMS  Some common signs and symptoms of mild alcohol intoxication include:  · Loss of coordination.  · Changes in mood and behavior.  · Impaired judgment.  · Slurred speech.  As alcohol intoxication progresses to more severe levels, other signs and symptoms will appear. These may include:  · Vomiting.  · Confusion and impaired memory.  · Slowed breathing.  · Seizures.  · Loss of consciousness.  DIAGNOSIS   Your health care provider will take a medical history and perform a physical exam. You will be asked about the amount and type of alcohol you have consumed. Blood tests will be done to measure the concentration of alcohol in your blood. In many places, your blood alcohol level must be lower than 80 mg/dL (0.08%) to legally drive. However, many dangerous effects of alcohol can occur at much lower levels.   TREATMENT   People with alcohol intoxication often do not require treatment. Most of the effects of alcohol intoxication are temporary, and they go away as the alcohol naturally  leaves the body. Your health care provider will monitor your condition until you are stable enough to go home. Fluids are sometimes given through an IV access tube to help prevent dehydration.   HOME CARE INSTRUCTIONS  · Do not drive after drinking alcohol.  · Stay hydrated. Drink enough water and fluids to keep your urine clear or pale yellow. Avoid caffeine.    · Only take over-the-counter or prescription medicines as directed by your health care provider.    SEEK MEDICAL CARE IF:   · You have persistent vomiting.    · You do not feel better after a few days.  · You have frequent alcohol intoxication. Your health care provider can help determine if you should see a substance use treatment counselor.  SEEK IMMEDIATE MEDICAL CARE IF:   · You become shaky or tremble when you try to stop drinking.    · You shake uncontrollably (seizure).    · You throw up (vomit) blood. This may be bright red or may look like black coffee grounds.    · You have blood in your stool. This may be bright red or may appear as a black, tarry, bad smelling stool.    · You become lightheaded or faint.    MAKE SURE YOU:   · Understand these instructions.  · Will watch your condition.  · Will get help right away if you are not doing well or get worse.  Document Released: 04/28/2005 Document Revised: 03/21/2013 Document Reviewed: 12/22/2012  ExitCare® Patient Information ©2015 [employer]. This information is not intended to replace advice given to you by your health care provider. Make sure   you discuss any questions you have with your health care provider.

## 2014-07-21 NOTE — ED Notes (Signed)
Per EMS pt consumed 1/2 of a bottle of Hennessy and has been vomiting since 0020.  Pt is A&O x 3.  12 lead en route showed sinus brady.  500 ml NS, 4 mg zofran given en route.

## 2014-08-05 ENCOUNTER — Ambulatory Visit (INDEPENDENT_AMBULATORY_CARE_PROVIDER_SITE_OTHER): Payer: Medicaid Other

## 2014-08-05 VITALS — BP 109/65 | HR 67 | Temp 98.7°F | Wt 102.0 lb

## 2014-08-05 DIAGNOSIS — Z3042 Encounter for surveillance of injectable contraceptive: Secondary | ICD-10-CM

## 2014-08-05 MED ORDER — MEDROXYPROGESTERONE ACETATE 104 MG/0.65ML ~~LOC~~ SUSP
104.0000 mg | Freq: Once | SUBCUTANEOUS | Status: AC
  Administered 2014-08-05: 104 mg via SUBCUTANEOUS

## 2014-08-05 NOTE — Progress Notes (Signed)
Patient here today for depo provera  injection-- within appropriate time frame. Depo provera  administered into R anterior thigh. Patient tolerated well. No questions or concerns or problems to report. To return between 3/25-4/11 for next injection.

## 2014-10-28 ENCOUNTER — Ambulatory Visit (INDEPENDENT_AMBULATORY_CARE_PROVIDER_SITE_OTHER): Payer: Medicaid Other | Admitting: *Deleted

## 2014-10-28 VITALS — BP 116/68 | HR 93 | Wt 102.7 lb

## 2014-10-28 DIAGNOSIS — Z3042 Encounter for surveillance of injectable contraceptive: Secondary | ICD-10-CM

## 2014-10-28 MED ORDER — MEDROXYPROGESTERONE ACETATE 104 MG/0.65ML ~~LOC~~ SUSP
104.0000 mg | Freq: Once | SUBCUTANEOUS | Status: AC
  Administered 2014-10-28: 104 mg via SUBCUTANEOUS

## 2015-01-20 ENCOUNTER — Encounter: Payer: Self-pay | Admitting: *Deleted

## 2015-01-20 ENCOUNTER — Ambulatory Visit (INDEPENDENT_AMBULATORY_CARE_PROVIDER_SITE_OTHER): Payer: Medicaid Other | Admitting: *Deleted

## 2015-01-20 VITALS — Wt 100.7 lb

## 2015-01-20 DIAGNOSIS — Z3042 Encounter for surveillance of injectable contraceptive: Secondary | ICD-10-CM | POA: Diagnosis present

## 2015-01-20 MED ORDER — MEDROXYPROGESTERONE ACETATE 150 MG/ML IM SUSP
150.0000 mg | Freq: Once | INTRAMUSCULAR | Status: AC
  Administered 2015-01-20: 150 mg via INTRAMUSCULAR

## 2015-04-09 ENCOUNTER — Ambulatory Visit: Payer: Medicaid Other

## 2015-04-23 ENCOUNTER — Ambulatory Visit (INDEPENDENT_AMBULATORY_CARE_PROVIDER_SITE_OTHER): Payer: Medicaid Other | Admitting: Neurology

## 2015-04-23 ENCOUNTER — Encounter: Payer: Self-pay | Admitting: Neurology

## 2015-04-23 VITALS — BP 100/72 | HR 90 | Ht 59.0 in | Wt 100.0 lb

## 2015-04-23 DIAGNOSIS — G513 Clonic hemifacial spasm: Secondary | ICD-10-CM

## 2015-04-23 DIAGNOSIS — G5139 Clonic hemifacial spasm, unspecified: Secondary | ICD-10-CM

## 2015-04-23 NOTE — Progress Notes (Signed)
PATIENT: [name] DOB: 05-11-1981  Chief Complaint  Patient presents with  . Hemifacial Spasms    Reports worsening of her right sided facial spasms.  She was offered Xeomin in the past but declined due concerns over side effects.  She would like to discuss the injections again.     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  She noticed left mouth corner twitching while talking, she denies visual change, no hearing loss, no left facial sensory loss.   She has tried gabapentin, baclofen in the past without helping her symptoms.    REVIEW OF SYSTEMS: Full 14 system review of systems performed and notable only for as above   ALLERGIES: Allergies  Allergen Reactions  . Promethazine Hcl Palpitations    Makes her jittery    HOME MEDICATIONS: No current outpatient prescriptions on file.   No current facility-administered medications for this visit.    PAST MEDICAL HISTORY: Past Medical History  Diagnosis Date  . Scoliosis   . Asthma   . Abnormal Pap smear 2011    repeat WNL; cryo 20011    PAST SURGICAL HISTORY: Past Surgical History  Procedure Laterality Date  . Hand surgery Left   . Dilation and curettage of uterus      FAMILY HISTORY: Family History  Problem Relation Age of Onset  . Anesthesia problems Neg Hx   . Diabetes Mother   . Sickle cell anemia Mother     SOCIAL HISTORY:  Social History   Social History  . Marital Status: Legally Separated    Spouse Name: N/A  . Number of  Children: 3  . Years of Education: 12+   Occupational History  . unemployed    Social History Main Topics  . Smoking status: Current Every Day Smoker -- 0.35 packs/day    Types: Cigarettes  . Smokeless tobacco: Never Used  . Alcohol Use: Yes     Comment: occasionally  . Drug Use: No  . Sexual Activity: Yes    Birth Control/ Protection: Implant     Comment: stopped taking BCP 11/2012   Other Topics Concern  . Not on file   Social History Narrative   Pt lives at home with her children. Patient is sep rated . Patient is not working at this time.   Caffeine Use- 2 cups of coffee daily.   Right handed.     PHYSICAL EXAM   Filed Vitals:   04/23/15 0858  BP: 100/72  Pulse: 90  Height:  (1.499 m)  Weight: 100 lb (45.36 kg)    Not recorded      Body mass index is 20.19 kg/(m^2).  PHYSICAL EXAMNIATION:  Gen: NAD, conversant, well nourised, obese, well groomed                     Cardiovascular: Regular rate rhythm, no peripheral edema, warm, nontender. Eyes: Conjunctivae clear without exudates or hemorrhage Neck: Supple, no carotid bruise. Pulmonary: Clear to auscultation bilaterally   NEUROLOGICAL EXAM:  MENTAL STATUS: Speech:  Speech is normal; fluent and spontaneous with normal comprehension.  Cognition:     Orientation to time, place and person     Normal recent and remote memory     Normal Attention span and concentration     Normal Language, naming, repeating,spontaneous speech     Fund of knowledge   CRANIAL NERVES: CN II: Visual fields are full to confrontation. Fundoscopic exam is normal.  Pupils are round equal and briskly reactive to light. CN III, IV, VI: extraocular movement are normal. No ptosis. CN V: Facial sensation is intact to pinprick in all 3 divisions bilaterally. Corneal responses are intact.  CN VII: She has mild left eye closure, left cheek puff weakness, she has frequent left zygomaticus, nasalis, left depressor anguli oris  muscle twitching CN VIII: Hearing is normal to rubbing fingers CN IX, X: Palate elevates symmetrically. Phonation is normal. CN XI: Head turning and shoulder shrug are intact CN XII: Tongue is midline with normal movements and no atrophy.  MOTOR: There is no pronator drift of out-stretched arms. Muscle bulk and tone are normal. Muscle strength is normal.  REFLEXES: Reflexes are 2+ and symmetric at the biceps, triceps, knees, and ankles. Plantar responses are flexor.  SENSORY: Intact to light touch, pinprick, position sense, and vibration sense are intact in fingers and toes.  COORDINATION: Rapid alternating movements and fine finger movements are intact. There is no dysmetria on finger-to-nose and heel-knee-shin.    GAIT/STANCE: Posture is normal. Gait is steady with normal steps, base, arm swing, and turning. Heel and toe walking are normal. Tandem gait is normal.  Romberg is absent.   DIAGNOSTIC DATA (LABS, IMAGING, TESTING) - I reviewed patient records, labs, notes, testing and imaging myself where available.   ASSESSMENT AND PLAN  [name] is a 34 y.o. female   Left hemifacial spasm  preauthorization for EMG guided xeomin injection  return to clinic in one month  Levert Feinstein, M.D. Ph.D.  [facility] [address], [address] [place], [place] [address] Ph: [phone] Fax: [phone]  [id number]

## 2015-05-26 ENCOUNTER — Ambulatory Visit: Payer: Medicaid Other | Admitting: Neurology

## 2015-05-26 ENCOUNTER — Telehealth: Payer: Self-pay | Admitting: *Deleted

## 2015-05-26 NOTE — Telephone Encounter (Signed)
No showed follow up appointment. 

## 2015-05-27 ENCOUNTER — Encounter: Payer: Self-pay | Admitting: Neurology

## 2015-06-05 ENCOUNTER — Telehealth: Payer: Self-pay | Admitting: Neurology

## 2015-06-05 NOTE — Telephone Encounter (Signed)
Called patient to schedule XEOMIN INJ. Left a VM asking her to call me back.

## 2015-11-25 IMAGING — US US OB FOLLOW-UP
1 series · 12 of 28 positions shown · non-contrast
Comparison: none

[Series 1: us ob follow up · 12 of 53 slices shown]
[im 2/53]
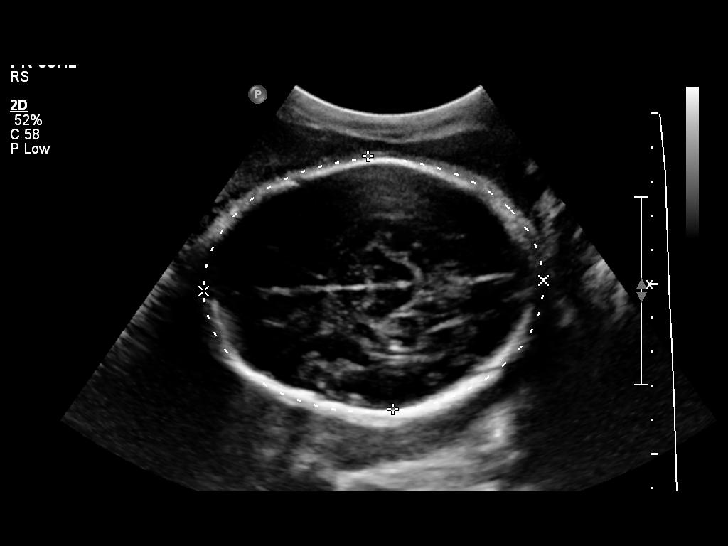
[im 6/53]
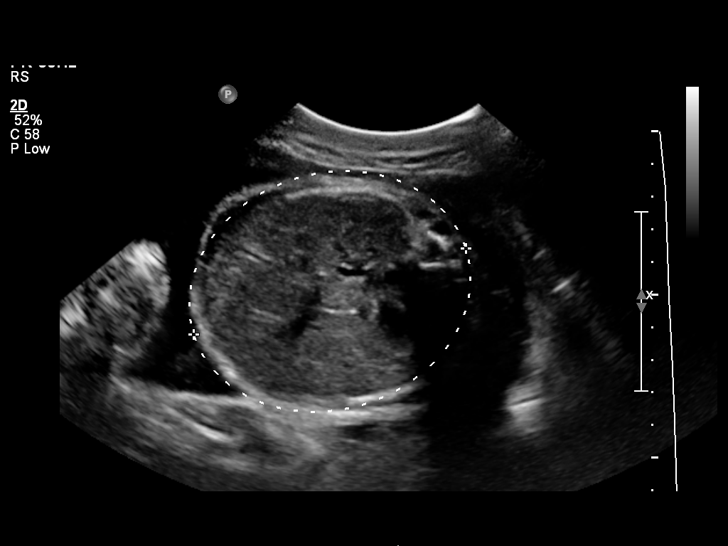
[im 10/53]
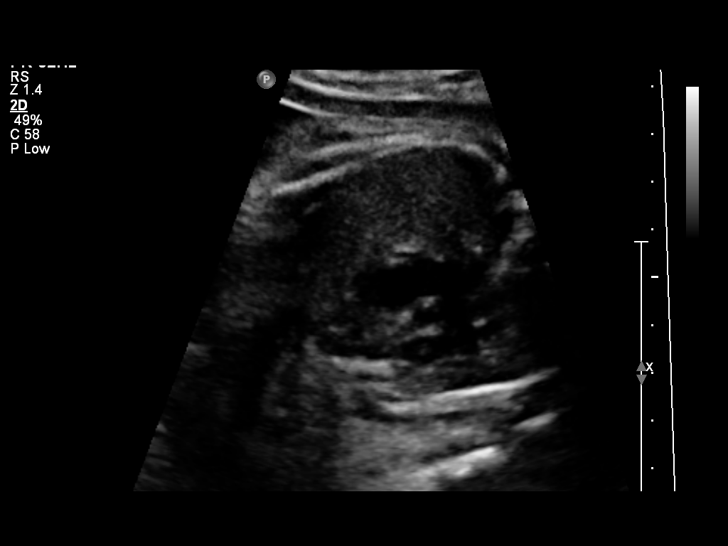
[im 16/53]
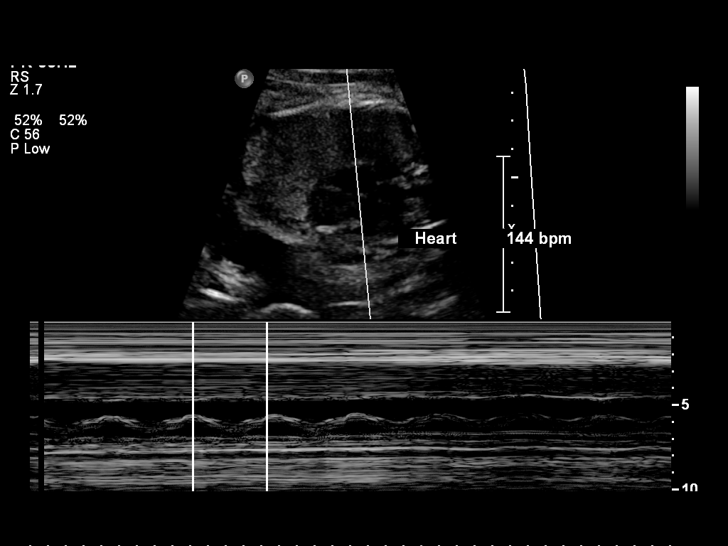
[im 20/53]
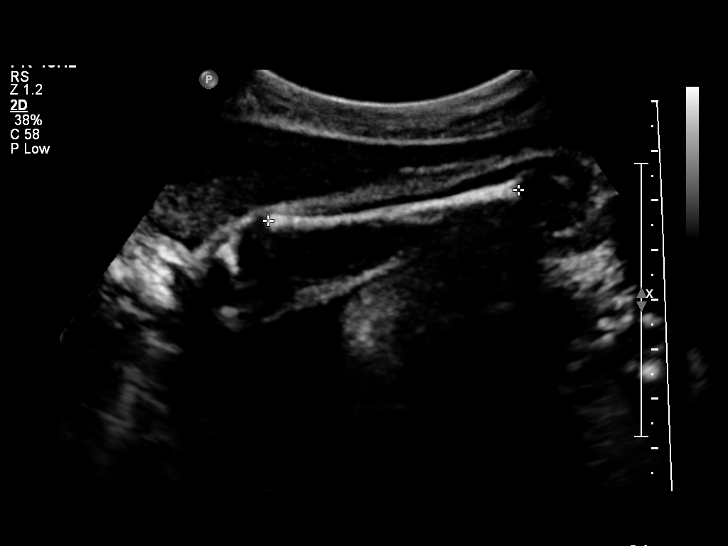
[im 24/53]
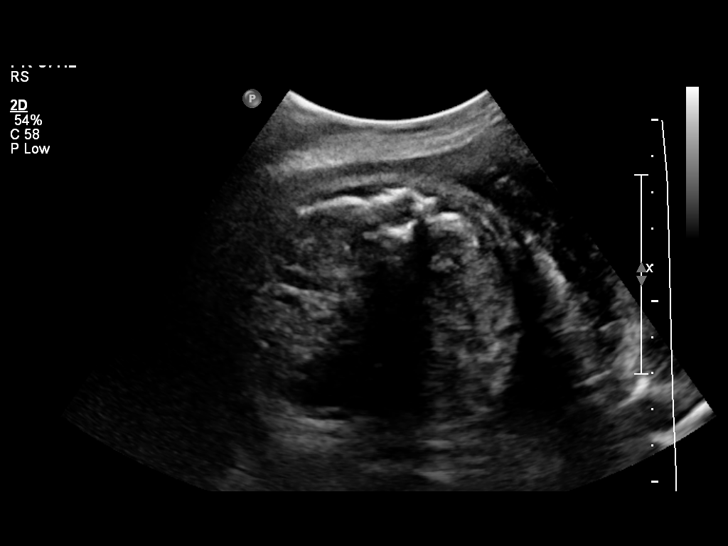
[im 29/53]
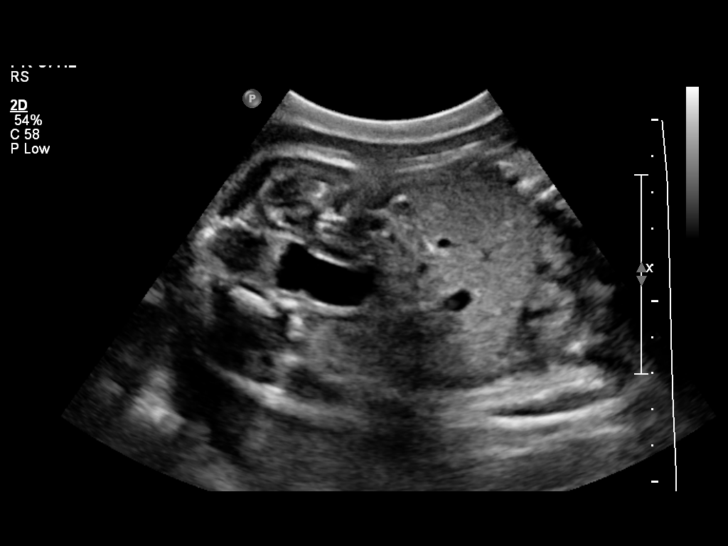
[im 33/53]
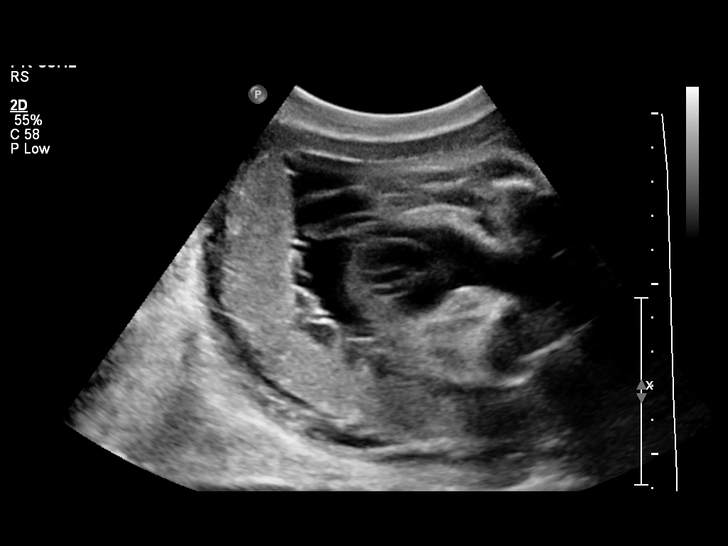
[im 37/53]
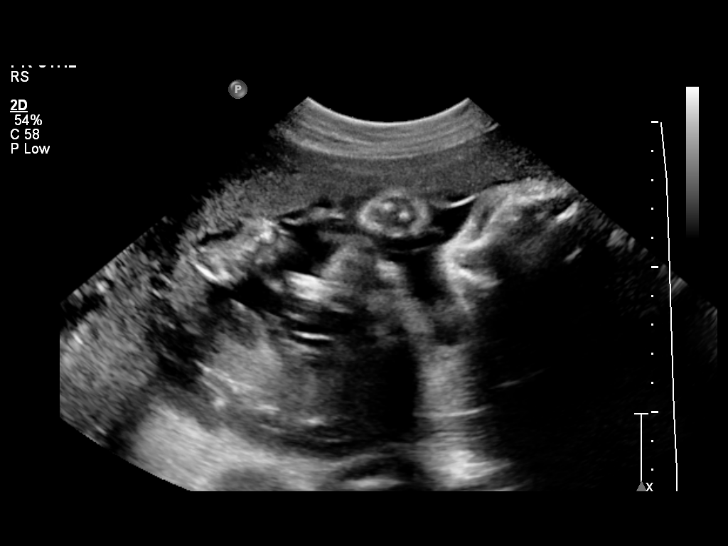
[im 43/53]
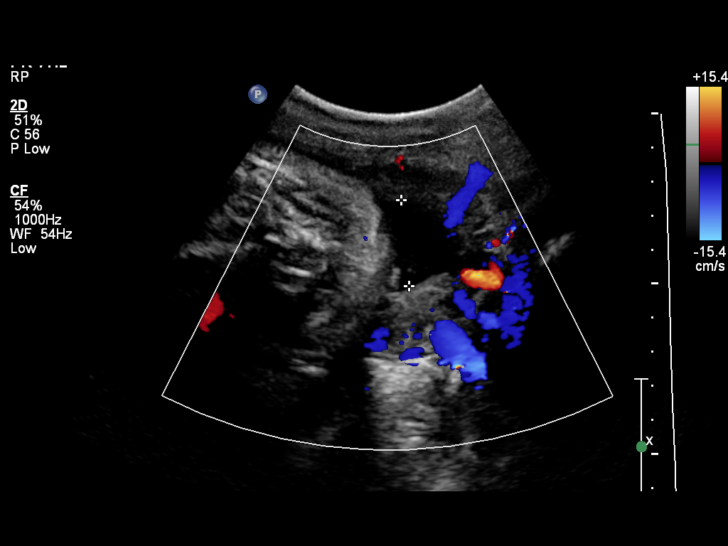
[im 47/53]
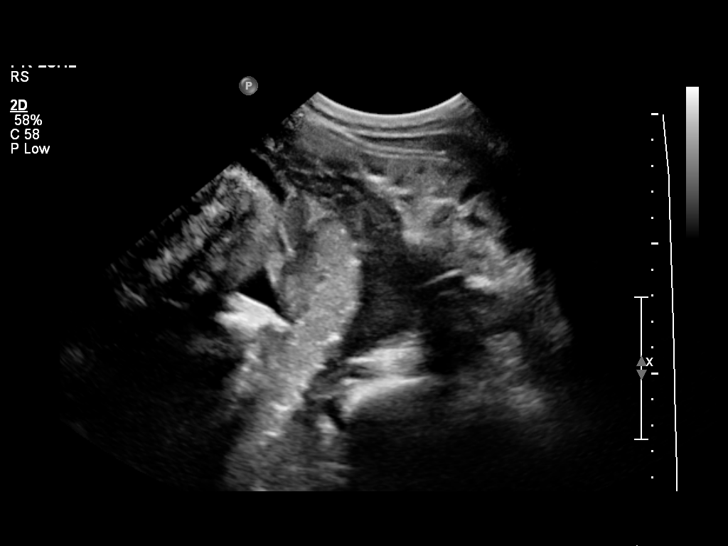
[im 51/53]
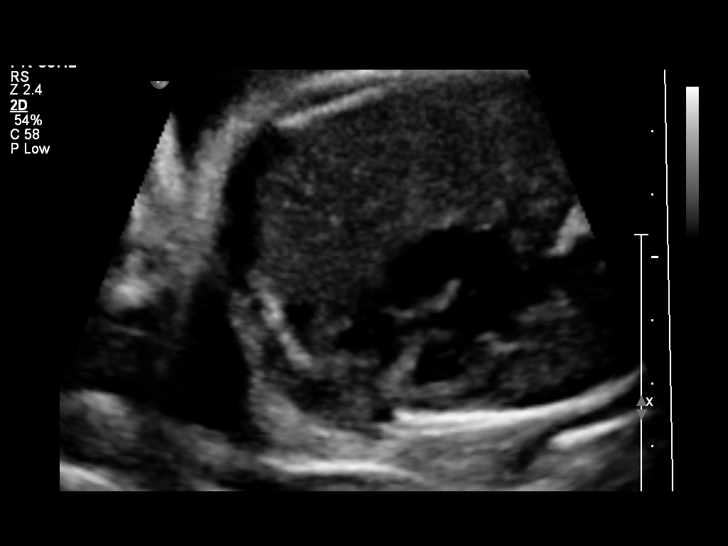

[12 of 28 positions shown; findings below may reference images not displayed]

OBSTETRICS REPORT
                      (Signed Final 02/13/2014 [DATE])

Service(s) Provided

 US OB FOLLOW UP                                       76816.1
Indications

 Size less than dates (Small for gestational [AGE]
 FGR)
Fetal Evaluation

 Num Of Fetuses:    1
 Fetal Heart Rate:  144                          bpm
 Cardiac Activity:  Observed
 AFI Sum:     8.72    cm        5  %Tile     Larg Pckt:    2.52  cm
 RUQ:   2.03    cm   RLQ:    1.84   cm    LUQ:   2.33    cm   LLQ:    2.52   cm
Biometry

 BPD:     74.1  mm     G. Age:  29w 5d                CI:         73.6   70 - 86
                                                      FL/HC:      20.6   19.3 -

 HC:     274.4  mm     G. Age:  30w 0d        5  %    HC/AC:      1.05   0.96 -

 AC:     260.9  mm     G. Age:  30w 1d       28  %    FL/BPD:     76.2   71 - 87
 FL:      56.5  mm     G. Age:  29w 5d       10  %    FL/AC:      21.7   20 - 24
 HUM:       51  mm     G. Age:  29w 6d       31  %

 Est. FW:    8948  gm      3 lb 5 oz     37  %
Gestational Age

 LMP:           30w 6d        Date:  07/12/13                 EDD:   04/18/14
 U/S Today:     29w 6d                                        EDD:   04/25/14
 Best:          30w 6d     Det. By:  LMP  (07/12/13)          EDD:   04/18/14
Anatomy

 Cranium:          Appears normal         Aortic Arch:      Previously seen
 Fetal Cavum:      Previously seen        Ductal Arch:      Previously seen
 Ventricles:       Appears normal         Diaphragm:        Previously seen
 Choroid Plexus:   Previously seen        Stomach:          Appears normal, left
                                                            sided
 Cerebellum:       Previously seen        Abdomen:          Appears normal
 Posterior Fossa:  Previously seen        Abdominal Wall:   Previously seen
 Nuchal Fold:      Not applicable (>20    Cord Vessels:     Previously seen
                   wks GA)
 Face:             Orbits and profile     Kidneys:          Appear normal
                   previously seen
 Lips:             Previously seen        Bladder:          Appears normal
 Heart:            Appears normal         Spine:            Previously seen
                   (4CH, axis, and
                   situs)
 RVOT:             Appears normal         Lower             Previously seen
                                          Extremities:
 LVOT:             Appears normal         Upper             Previously seen
                                          Extremities:

 Other:  Fetus appears to be a male. Nasal bone , 5th digit prev. seen
Targeted Anatomy

 Fetal Central Nervous System
 Lat. Ventricles:
Cervix Uterus Adnexa

 Cervix:       Not visualized (advanced GA >45wks)

 Adnexa:     No abnormality visualized.
Impression

 SIUP at 30+6 weeks
 Normal interval anatomy; anatomic survey complete
 Normal amniotic fluid volume
 Appropriate interval growth with EFW at the 37th %tile

## 2015-12-21 IMAGING — US US OB FOLLOW-UP
2 series · 13 of 28 positions shown · non-contrast
Comparison: none

[Series 1: us ob follow up · 6 of 35 slices shown (1 of 2)]
[im 3/35]
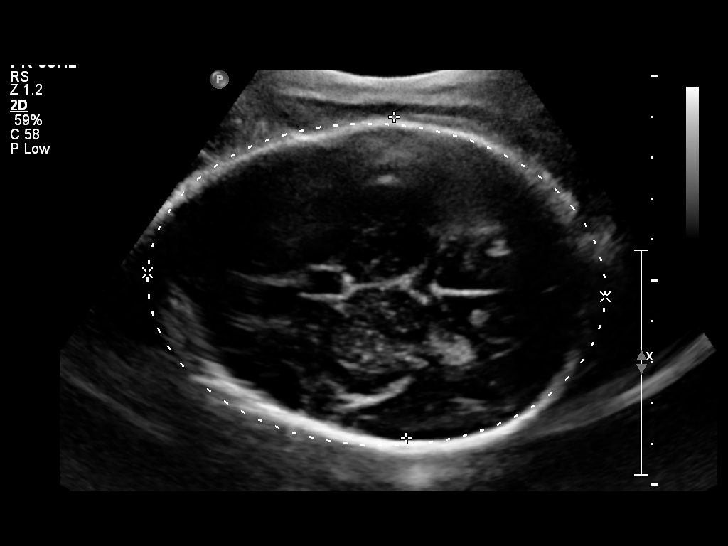
[im 8/35]
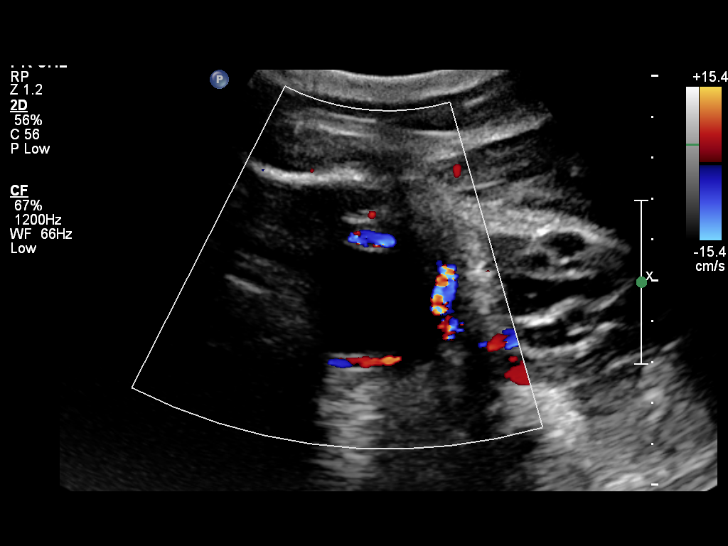
[im 14/35]
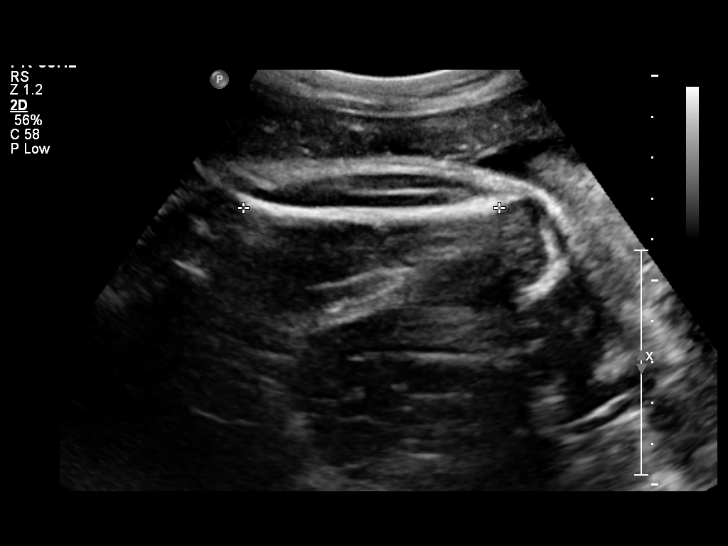
[im 19/35]
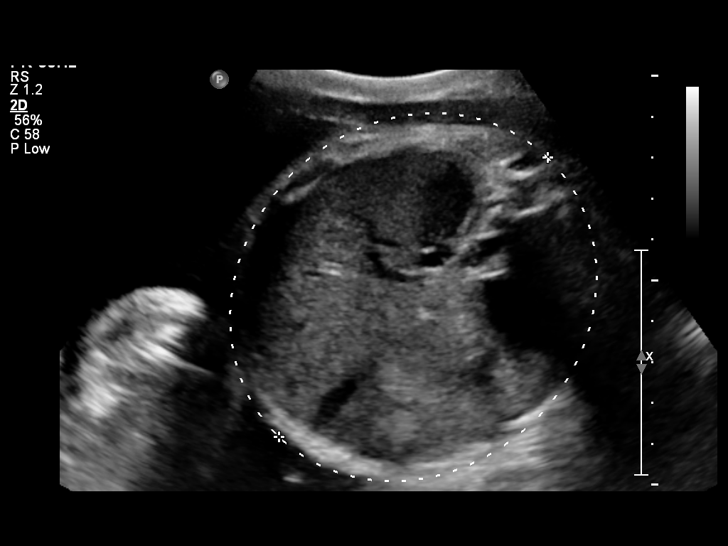
[im 24/35]
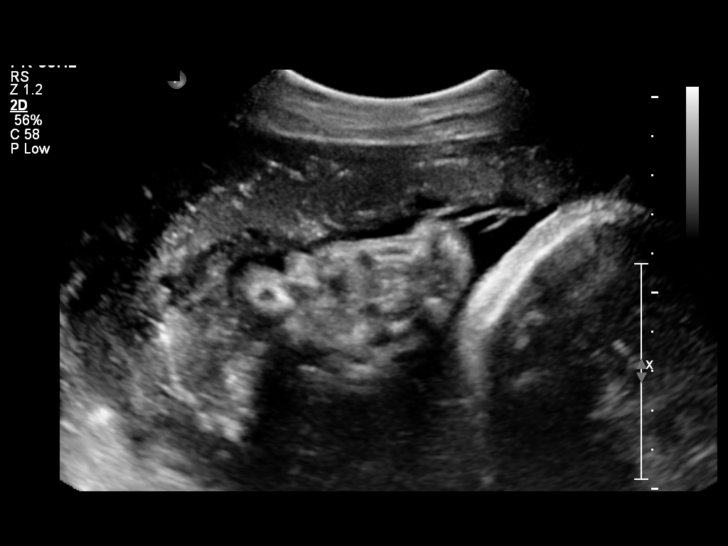
[im 29/35]
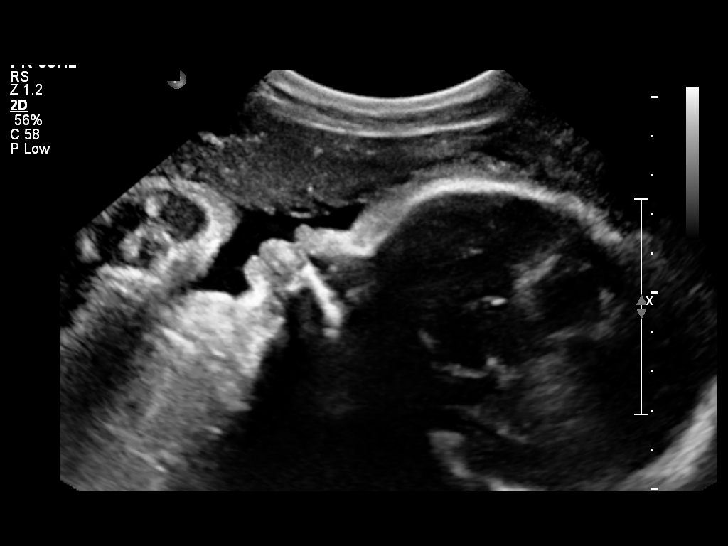

[Series 1: us ob follow up · 33 acquisitions, 7 frames shown (2 of 2)]
[im 1/33]
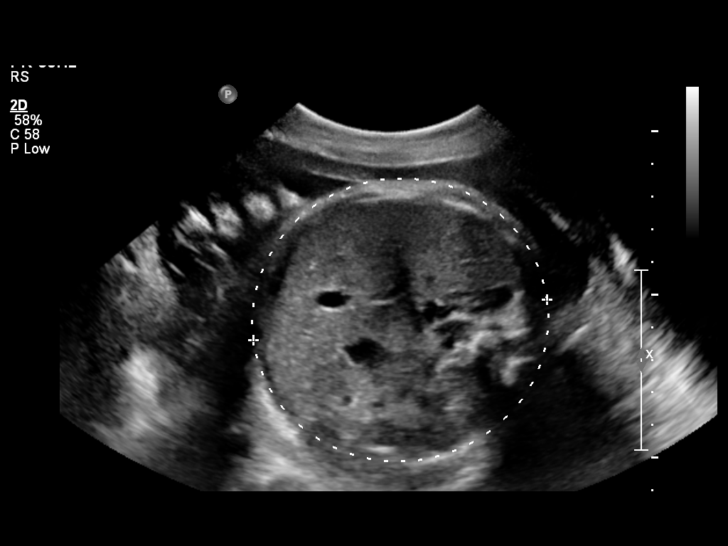
[im 5/33]
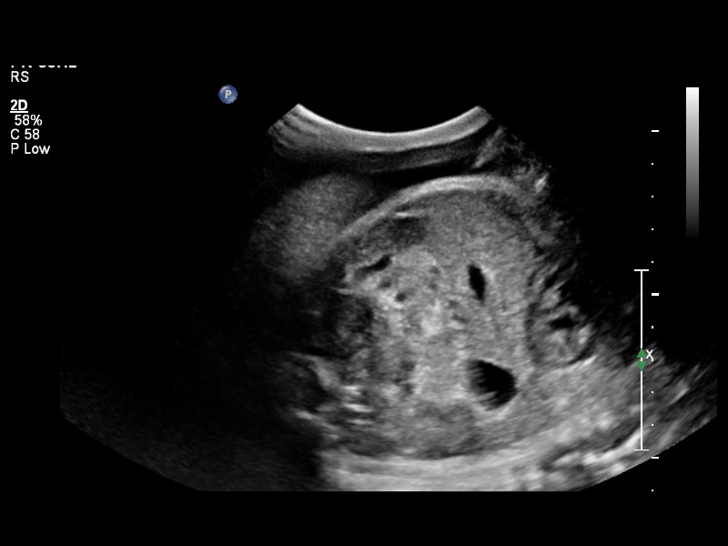
[im 10/33]
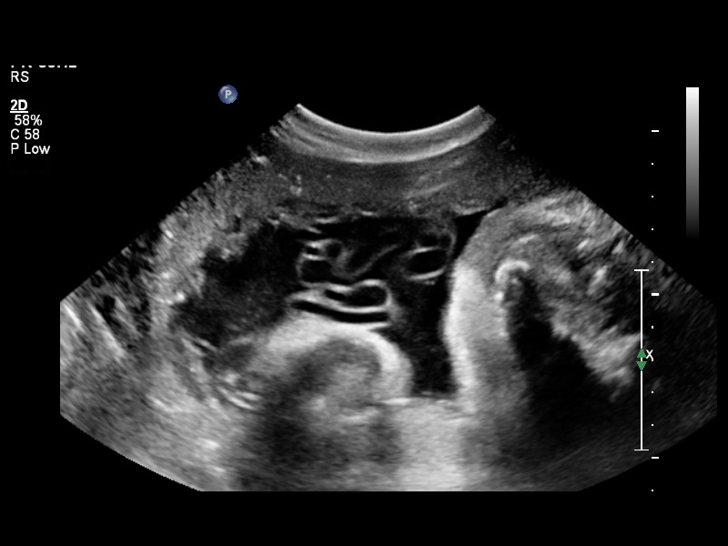
[im 15/33]
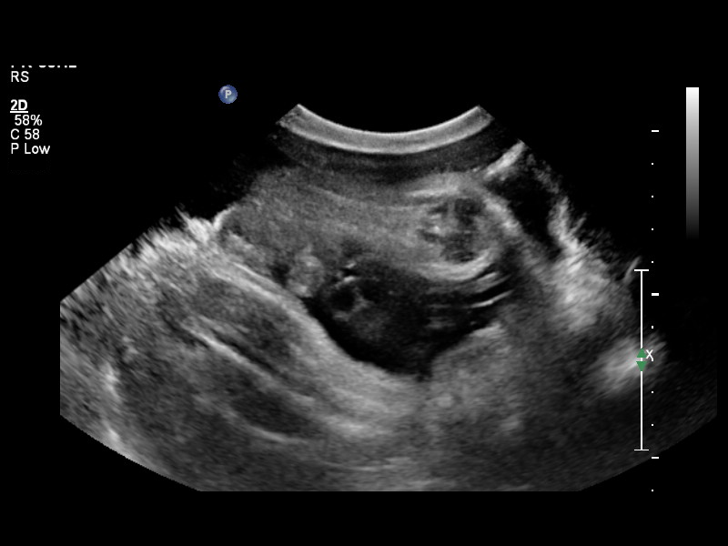
[im 20/33]
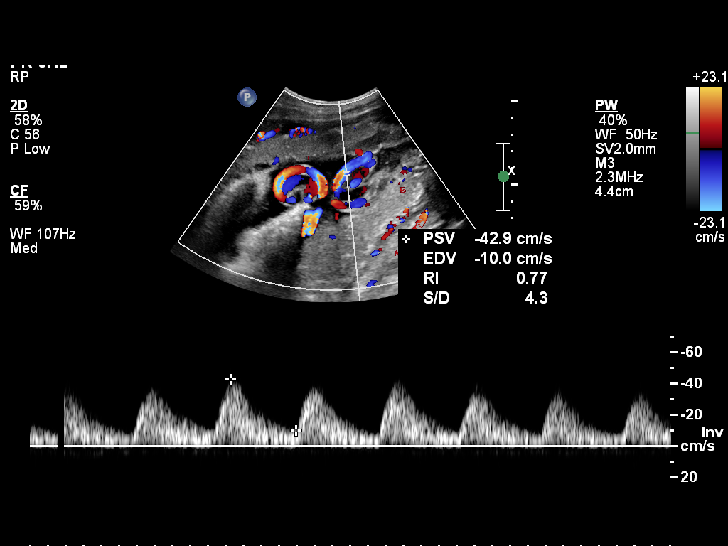
[im 25/33]
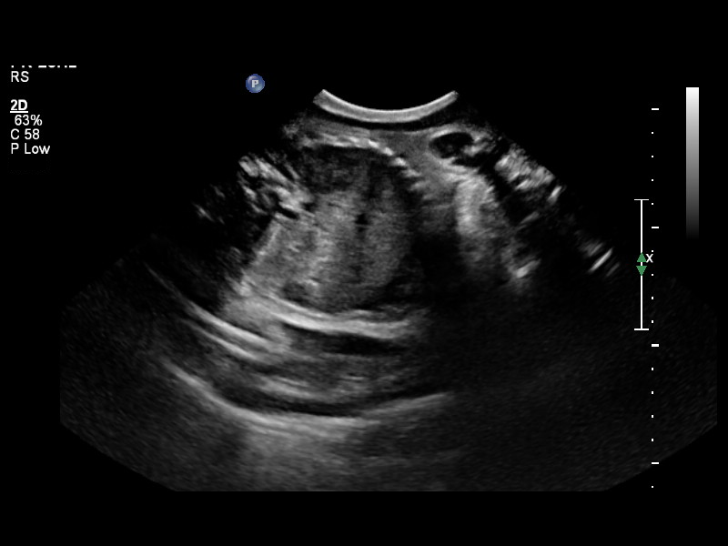
[im 30/33]
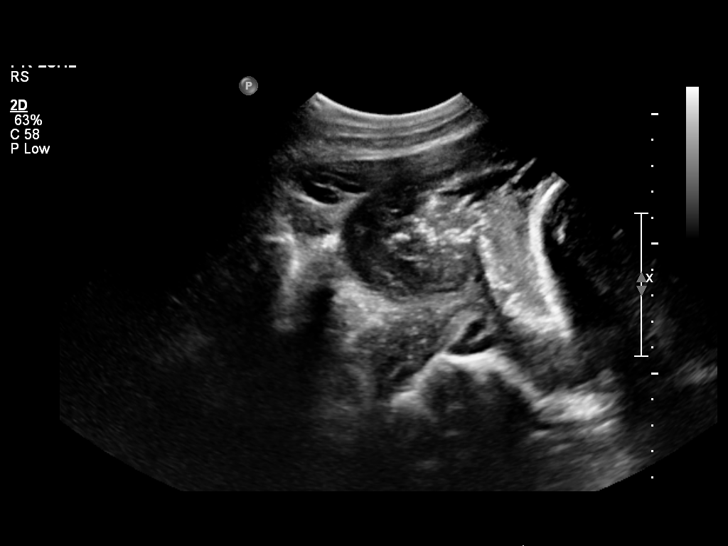

[13 of 28 positions shown; findings below may reference images not displayed]

OBSTETRICS REPORT
                      (Signed Final 03/11/2014 [DATE])

Service(s) Provided

 US OB FOLLOW UP                                       76816.1
 US UA CORD DOPPLER                                    76820.0
Indications

 Size less than dates (Small for gestational [AGE]
 FGR)
Fetal Evaluation

 Num Of Fetuses:    1
 Fetal Heart Rate:  138                          bpm
 Cardiac Activity:  Observed
 Presentation:      Cephalic
 Placenta:          Anterior, above cervical os
 P. Cord            Previously Visualized
 Insertion:

 Amniotic Fluid
 AFI FV:      Subjectively within normal limits
 AFI Sum:     14.82   cm       53  %Tile     Larg Pckt:    4.94  cm
 RUQ:   4.94    cm   RLQ:    1.85   cm    LUQ:   4.76    cm   LLQ:    3.27   cm
Biophysical Evaluation

 Amniotic F.V:   Within normal limits       F. Tone:        Observed
 F. Movement:    Observed                   Score:          [DATE]
 F. Breathing:   Observed
Biometry

 BPD:     78.1  mm     G. Age:  31w 2d                CI:        70.64   70 - 86
                                                      FL/HC:      21.9   20.1 -

 HC:     296.2  mm     G. Age:  32w 5d      < 3  %    HC/AC:      1.06   0.93 -

 AC:     278.9  mm     G. Age:  32w 0d      < 3  %    FL/BPD:     83.1   71 - 87
 FL:      64.9  mm     G. Age:  33w 3d       17  %    FL/AC:      23.3   20 - 24

 Est. FW:    3836  gm      4 lb 6 oz     21  %
Gestational Age

 LMP:           34w 4d        Date:  07/12/13                 EDD:   04/18/14
 U/S Today:     32w 3d                                        EDD:   05/03/14
 Best:          34w 4d     Det. By:  LMP  (07/12/13)          EDD:   04/18/14
Anatomy

 Cranium:          Appears normal         Stomach:          Appears normal, left
                                                            sided
 Fetal Cavum:      Appears normal         Kidneys:          Appear normal
 Heart:            Appears normal         Bladder:          Appears normal
                   (4CH, axis, and
                   situs)
 Diaphragm:        Appears normal
Doppler - Fetal Vessels

 Umbilical Artery
 S/D:   4.1        > 97.5  %tile
 Umbilical Artery
 Absent DFV:    No     Reverse DFV:    No

Impression

 Single IUP at 34w 4d
 The overall estimated fetal weight is at the 21st %tile.  The
 AC measures < 3rd %tile (lagging growth)
 Active fetus with BPP of [DATE]
 UA Doppler studies elevated for gestational age but no
 absent or reversed diastolic flow
 Normal amniotic fluid volume
Recommendations

 Recommend 2x weekly NSTs with weekly AFIs / UA Doppler
 studies

 Discussed recommendations with Dr. Giorgi

## 2016-06-12 ENCOUNTER — Inpatient Hospital Stay (HOSPITAL_COMMUNITY)
Admission: AD | Admit: 2016-06-12 | Discharge: 2016-06-12 | Disposition: A | Discharge status: Home / Self Care | Payer: Medicaid Other | Source: Ambulatory Visit | Attending: Obstetrics and Gynecology | Admitting: Obstetrics and Gynecology

## 2016-06-12 ENCOUNTER — Encounter (HOSPITAL_COMMUNITY): Payer: Self-pay | Admitting: *Deleted

## 2016-06-12 DIAGNOSIS — M419 Scoliosis, unspecified: Secondary | ICD-10-CM | POA: Insufficient documentation

## 2016-06-12 DIAGNOSIS — N760 Acute vaginitis: Secondary | ICD-10-CM | POA: Insufficient documentation

## 2016-06-12 DIAGNOSIS — B9689 Other specified bacterial agents as the cause of diseases classified elsewhere: Secondary | ICD-10-CM | POA: Diagnosis not present

## 2016-06-12 DIAGNOSIS — J45909 Unspecified asthma, uncomplicated: Secondary | ICD-10-CM | POA: Insufficient documentation

## 2016-06-12 DIAGNOSIS — F17210 Nicotine dependence, cigarettes, uncomplicated: Secondary | ICD-10-CM | POA: Diagnosis not present

## 2016-06-12 DIAGNOSIS — N921 Excessive and frequent menstruation with irregular cycle: Secondary | ICD-10-CM | POA: Insufficient documentation

## 2016-06-12 LAB — WET PREP, GENITAL
Sperm: NONE SEEN
TRICH WET PREP: NONE SEEN
YEAST WET PREP: NONE SEEN

## 2016-06-12 LAB — URINALYSIS, ROUTINE W REFLEX MICROSCOPIC
Bilirubin Urine: NEGATIVE
GLUCOSE, UA: NEGATIVE mg/dL
KETONES UR: NEGATIVE mg/dL
LEUKOCYTES UA: NEGATIVE
NITRITE: NEGATIVE
PH: 5.5 (ref 5.0–8.0)
Protein, ur: NEGATIVE mg/dL
Specific Gravity, Urine: <1.005 — ABNORMAL LOW (ref 1.005–1.030)

## 2016-06-12 LAB — URINE MICROSCOPIC-ADD ON: BACTERIA UA: NONE SEEN

## 2016-06-12 LAB — POCT PREGNANCY, URINE: Preg Test, Ur: NEGATIVE

## 2016-06-12 MED ORDER — METRONIDAZOLE 500 MG PO TABS: 500.0000 mg | ORAL_TABLET | Freq: Two times a day (BID) | ORAL | 0 refills | Status: AC

## 2016-06-12 NOTE — MAU Provider Note (Signed)
History     CSN: [id number]  Arrival date and time: 06/12/16 [id number]   First Provider Initiated Contact with Patient 06/12/16 [phone]      Chief Complaint  Patient presents with  . Metrorrhagia   HPI [name] 35 y.o. Comes in with vaginal discharge and thinks she has BV.  Has had irregular bleeding and wants to know why.  Stopped Depo one year ago.  OB History    Gravida Para Term Preterm AB Living   5 3 2 1 2 3    SAB TAB Ectopic Multiple Live Births   0 1 0 0 3      Past Medical History:  Diagnosis Date  . Abnormal Pap smear 2011   repeat WNL; cryo 20011  . Asthma   . Scoliosis     Past Surgical History:  Procedure Laterality Date  . DILATION AND CURETTAGE OF UTERUS    . HAND SURGERY Left     Family History  Problem Relation Age of Onset  . Diabetes Mother   . Sickle cell anemia Mother   . Anesthesia problems Neg Hx     Social History  Substance Use Topics  . Smoking status: Current Every Day Smoker    Packs/day: 0.35    Types: Cigarettes  . Smokeless tobacco: Never Used  . Alcohol use Yes     Comment: occasionally    Allergies:  Allergies  Allergen Reactions  . Promethazine Hcl Palpitations    Makes her jittery    Prescriptions Prior to Admission  Medication Sig Dispense Refill Last Dose  . Ibuprofen-Diphenhydramine Cit (ADVIL PM) 200-38 MG TABS Take 1 tablet by mouth as needed.    Past Month at Unknown time    Review of Systems  Constitutional: Negative for fever.  Gastrointestinal: Negative for abdominal pain, nausea and vomiting.  Genitourinary:       Vaginal discharge. Irregular vaginal bleeding. No dysuria.   Physical Exam   Blood pressure 115/71, pulse 98, temperature 98.1 F (36.7 C), temperature source Oral, resp. rate 16, height 4\' 11"  (1.499 m), weight 128 lb (58.1 kg).  Physical Exam  Nursing note and vitals reviewed. Constitutional: She is oriented to person, place, and time. She appears well-developed and  well-nourished.  HENT:  Head: Normocephalic.  Eyes: EOM are normal.  Neck: Neck supple.  GI: Soft. There is no tenderness.  Genitourinary:  Genitourinary Comments: Speculum exam: Vagina - Small amount of white discharge, no odor Cervix - No contact bleeding Bimanual exam: Cervix closed Uterus non tender, normal size Adnexa non tender, no masses bilaterally GC/Chlam, wet prep done Chaperone present for exam.   Musculoskeletal: Normal range of motion.  Neurological: She is alert and oriented to person, place, and time.  Skin: Skin is warm and dry.  Psychiatric: She has a normal mood and affect.    MAU Course  Procedures Results for orders placed or performed during the hospital encounter of 06/12/16 (from the past 24 hour(s))  Urinalysis, Routine w reflex microscopic ([facility])     Status: Abnormal   Collection Time: 06/12/16  8:04 AM  Result Value Ref Range   Color, Urine YELLOW YELLOW   APPearance CLEAR CLEAR   Specific Gravity, Urine <1.005 (L) 1.005 - 1.030   pH 5.5 5.0 - 8.0   Glucose, UA NEGATIVE NEGATIVE mg/dL   Hgb urine dipstick TRACE (A) NEGATIVE   Bilirubin Urine NEGATIVE NEGATIVE   Ketones, ur NEGATIVE NEGATIVE mg/dL   Protein, ur NEGATIVE NEGATIVE mg/dL  Nitrite NEGATIVE NEGATIVE   Leukocytes, UA NEGATIVE NEGATIVE  Urine microscopic-add on     Status: Abnormal   Collection Time: 06/12/16  8:04 AM  Result Value Ref Range   Squamous Epithelial / LPF 0-5 (A) NONE SEEN   WBC, UA 0-5 0 - 5 WBC/hpf   RBC / HPF 0-5 0 - 5 RBC/hpf   Bacteria, UA NONE SEEN NONE SEEN  Pregnancy, urine POC     Status: None   Collection Time: 06/12/16  8:09 AM  Result Value Ref Range   Preg Test, Ur NEGATIVE NEGATIVE  Wet prep, genital     Status: Abnormal   Collection Time: 06/12/16  8:40 AM  Result Value Ref Range   Yeast Wet Prep HPF POC NONE SEEN NONE SEEN   Trich, Wet Prep NONE SEEN NONE SEEN   Clue Cells Wet Prep HPF POC PRESENT (A) NONE SEEN   WBC, Wet Prep HPF POC  MANY (A) NONE SEEN   Sperm NONE SEEN     MDM   Assessment and Plan  Bacterial vaginosis  Plan Medication Drink at least 8 8-oz glasses of water every day. Make an appointment with your doctor for a reliable form of contraception - considering Tubal Ligation Your depo is no longer active.  Condoms always for now for contraception and protection from STI  Lilliane Sposito L Iman Reinertsen 06/12/2016, 9:45 AM

## 2016-06-12 NOTE — Discharge Instructions (Signed)
Medication is at your pharmacy. Drink at least 8 8-oz glasses of water every day. Make an appointment with your doctor for a reliable form of contraception - considering Tubal Ligation Your depo is no longer active.  Condoms always for now for contraception and protection from STI    Bacterial Vaginosis Bacterial vaginosis is an infection of the vagina. It happens when too many germs (bacteria) grow in the vagina. Having this infection puts you at risk for getting other infections from sex. Treating this infection can help lower your risk for other infections, such as:   Chlamydia.  Gonorrhea.  HIV.  Herpes. HOME CARE  Take your medicine as told by your doctor.  Finish your medicine even if you start to feel better.  Tell your sex partner that you have an infection. They should see their doctor for treatment.  During treatment:  Avoid sex or use condoms correctly.  Do not douche.  Do not drink alcohol unless your doctor tells you it is ok.  Do not breastfeed unless your doctor tells you it is ok. GET HELP IF:  You are not getting better after 3 days of treatment.  You have more grey fluid (discharge) coming from your vagina than before.  You have more pain than before.  You have a fever. MAKE SURE YOU:   Understand these instructions.  Will watch your condition.  Will get help right away if you are not doing well or get worse.   This information is not intended to replace advice given to you by your health care provider. Make sure you discuss any questions you have with your health care provider.   Document Released: 04/27/2008 Document Revised: 08/09/2014 Document Reviewed: 02/28/2013 Elsevier Interactive Patient Education [employer]2016 [employer]

## 2016-06-12 NOTE — MAU Note (Signed)
Patient presents with irregular periods for past couple weeks, thinks has BV has discharge with an odor x 2 weeks, denies burning with urination, LMP 05/31/16 lasting only 2 days.

## 2016-06-13 LAB — RPR: RPR Ser Ql: NONREACTIVE

## 2016-06-13 LAB — HIV ANTIBODY (ROUTINE TESTING W REFLEX): HIV SCREEN 4TH GENERATION: NONREACTIVE

## 2016-06-14 LAB — GC/CHLAMYDIA PROBE AMP (~~LOC~~) NOT AT ARMC
Chlamydia: NEGATIVE
Neisseria Gonorrhea: NEGATIVE
